# Patient Record
Sex: Female | Born: 1990 | Race: Black or African American | Hispanic: No | Marital: Single | Smoking: Current every day smoker
Health system: Southern US, Community
[De-identification: ages and names within clinical notes are randomized; demographics above are authoritative.]

## PROBLEM LIST (undated history)

## (undated) DIAGNOSIS — Z789 Other specified health status: Secondary | ICD-10-CM

## (undated) HISTORY — PX: NO PAST SURGERIES: SHX2092

## (undated) HISTORY — PX: OTHER SURGICAL HISTORY: SHX169

---

## 2011-11-15 DIAGNOSIS — O3430 Maternal care for cervical incompetence, unspecified trimester: Secondary | ICD-10-CM

## 2013-05-17 DIAGNOSIS — O3430 Maternal care for cervical incompetence, unspecified trimester: Secondary | ICD-10-CM

## 2017-03-12 DIAGNOSIS — O3430 Maternal care for cervical incompetence, unspecified trimester: Secondary | ICD-10-CM

## 2018-06-17 HISTORY — PX: CERVICAL CERCLAGE: SHX1329

## 2018-08-26 DIAGNOSIS — O3430 Maternal care for cervical incompetence, unspecified trimester: Secondary | ICD-10-CM

## 2019-04-04 ENCOUNTER — Emergency Department
Admission: EM | Admit: 2019-04-04 | Discharge: 2019-04-05 | Disposition: A | Discharge status: Home / Self Care | Payer: Medicaid Other | Attending: Emergency Medicine | Admitting: Emergency Medicine

## 2019-04-04 ENCOUNTER — Other Ambulatory Visit: Payer: Self-pay

## 2019-04-04 ENCOUNTER — Emergency Department: Payer: Medicaid Other

## 2019-04-04 DIAGNOSIS — N760 Acute vaginitis: Secondary | ICD-10-CM | POA: Insufficient documentation

## 2019-04-04 DIAGNOSIS — B9689 Other specified bacterial agents as the cause of diseases classified elsewhere: Secondary | ICD-10-CM | POA: Insufficient documentation

## 2019-04-04 DIAGNOSIS — R1032 Left lower quadrant pain: Secondary | ICD-10-CM | POA: Diagnosis not present

## 2019-04-04 DIAGNOSIS — O26891 Other specified pregnancy related conditions, first trimester: Secondary | ICD-10-CM

## 2019-04-04 DIAGNOSIS — O200 Threatened abortion: Secondary | ICD-10-CM

## 2019-04-04 DIAGNOSIS — O9089 Other complications of the puerperium, not elsewhere classified: Secondary | ICD-10-CM | POA: Diagnosis present

## 2019-04-04 DIAGNOSIS — Z3490 Encounter for supervision of normal pregnancy, unspecified, unspecified trimester: Secondary | ICD-10-CM

## 2019-04-04 LAB — CBC
HCT: 41 % (ref 36.0–46.0)
Hemoglobin: 14 g/dL (ref 12.0–15.0)
MCH: 32.4 pg (ref 26.0–34.0)
MCHC: 34.1 g/dL (ref 30.0–36.0)
MCV: 94.9 fL (ref 80.0–100.0)
Platelets: 309 10*3/uL (ref 150–400)
RBC: 4.32 MIL/uL (ref 3.87–5.11)
RDW: 13.7 % (ref 11.5–15.5)
WBC: 7.6 10*3/uL (ref 4.0–10.5)
nRBC: 0 % (ref 0.0–0.2)

## 2019-04-04 LAB — PREGNANCY, URINE: Preg Test, Ur: POSITIVE — AB

## 2019-04-04 LAB — WET PREP, GENITAL
Sperm: NONE SEEN
Trich, Wet Prep: NONE SEEN
Yeast Wet Prep HPF POC: NONE SEEN

## 2019-04-04 LAB — URINALYSIS, COMPLETE (UACMP) WITH MICROSCOPIC
Bacteria, UA: NONE SEEN
Bilirubin Urine: NEGATIVE
Glucose, UA: NEGATIVE mg/dL
Ketones, ur: 20 mg/dL — AB
Leukocytes,Ua: NEGATIVE
Nitrite: NEGATIVE
Protein, ur: NEGATIVE mg/dL
Specific Gravity, Urine: 1.024 (ref 1.005–1.030)
pH: 5 (ref 5.0–8.0)

## 2019-04-04 LAB — COMPREHENSIVE METABOLIC PANEL
ALT: 16 U/L (ref 0–44)
AST: 17 U/L (ref 15–41)
Albumin: 4.1 g/dL (ref 3.5–5.0)
Alkaline Phosphatase: 60 U/L (ref 38–126)
Anion gap: 9 (ref 5–15)
BUN: 10 mg/dL (ref 6–20)
CO2: 21 mmol/L — ABNORMAL LOW (ref 22–32)
Calcium: 9.2 mg/dL (ref 8.9–10.3)
Chloride: 110 mmol/L (ref 98–111)
Creatinine, Ser: 0.66 mg/dL (ref 0.44–1.00)
GFR calc Af Amer: >60 mL/min (ref >60)
GFR calc non Af Amer: >60 mL/min (ref >60)
Glucose, Bld: 96 mg/dL (ref 70–99)
Potassium: 3.7 mmol/L (ref 3.5–5.1)
Sodium: 140 mmol/L (ref 135–145)
Total Bilirubin: 0.3 mg/dL (ref 0.3–1.2)
Total Protein: 7.3 g/dL (ref 6.5–8.1)

## 2019-04-04 LAB — LIPASE, BLOOD: Lipase: 22 U/L (ref 11–51)

## 2019-04-04 LAB — ABO/RH: ABO/RH(D): O POS

## 2019-04-04 LAB — POCT PREGNANCY, URINE: Preg Test, Ur: POSITIVE — AB

## 2019-04-04 LAB — HCG, QUANTITATIVE, PREGNANCY: hCG, Beta Chain, Quant, S: 4191 m[IU]/mL — ABNORMAL HIGH (ref <5)

## 2019-04-04 MED ORDER — SODIUM CHLORIDE 0.9 % IV BOLUS
1000.0000 mL | Freq: Once | INTRAVENOUS | Status: AC
  Administered 2019-04-04: 1000 mL via INTRAVENOUS

## 2019-04-04 NOTE — ED Notes (Signed)
Pt stating she wants to leave due to not having a ride. Pt's phone dead. Pt given charger for phone, encouraged to stay. EDP in room telling pt benefits of staying.

## 2019-04-04 NOTE — ED Notes (Signed)
LMP 02/22/2019

## 2019-04-04 NOTE — ED Provider Notes (Signed)
[facility] Emergency Department Provider Note  ____________________________________________   First MD Initiated Contact with Patient 04/04/19 2105     (approximate)  I have reviewed the triage vital signs and the nursing notes.   HISTORY  Chief Complaint Abdominal Pain    HPI Kimberly Wyatt is a 28 y.o. female who had a vaginal delivery 6 months ago who was told that there was concern for some placenta being left place presents with abdominal cramping that is been getting worse.  Last menstrual period was seventh of September.  She is not breast-feeding.  Patient has had some intermittent vaginal bleeding but none currently.  Her abdominal cramping has been going on for about 1 week.  Is mostly in the left lower side.  The pain is moderate, intermittent, nothing makes it better, nothing makes it worse.  Denies any vomiting or fever.  Has a report of kidney stones but says that it was while she was pregnant and no interventions were done.      Medical: Pregnancy Surgical: None   Review of Systems Constitutional: No fever/chills Eyes: No visual changes. ENT: No sore throat. Cardiovascular: Denies chest pain. Respiratory: Denies shortness of breath. Gastrointestinal positive abdominal pain no nausea, no vomiting.  No diarrhea.  No constipation. Genitourinary: Negative for dysuria. Musculoskeletal: Negative for back pain. Skin: Negative for rash. Neurological: Negative for headaches, focal weakness or numbness. All other ROS negative ____________________________________________   PHYSICAL EXAM:  VITAL SIGNS: ED Triage Vitals  Enc Vitals Group     BP 04/04/19 1826 116/69     Pulse Rate 04/04/19 1826 (!) 106     Resp 04/04/19 1826 18     Temp 04/04/19 1826 98.3 F (36.8 C)     Temp Source 04/04/19 1826 Oral     SpO2 04/04/19 1826 100 %     Weight 04/04/19 1828 164 lb (74.4 kg)     Height 04/04/19 1828 5\' 4"  (1.626 m)     Head Circumference  --      Peak Flow --      Pain Score 04/04/19 1827 3     Pain Loc --      Pain Edu? --      Excl. in [place]? --     Constitutional: Alert and oriented. Well appearing and in no acute distress. Eyes: Conjunctivae are normal. EOMI. Head: Atraumatic. Nose: No congestion/rhinnorhea. Mouth/Throat: Mucous membranes are moist.   Neck: No stridor. Trachea Midline. FROM Cardiovascular: Normal rate, regular rhythm. Grossly normal heart sounds.  Good peripheral circulation. Respiratory: Normal respiratory effort.  No retractions. Lungs CTAB. Gastrointestinal: Soft with slight left lower quadrant tenderness.  No rebound.  No guarding. No distention. No abdominal bruits.  Musculoskeletal: No lower extremity tenderness nor edema.  No joint effusions. Neurologic:  Normal speech and language. No gross focal neurologic deficits are appreciated.  Skin:  Skin is warm, dry and intact. No rash noted. Psychiatric: Mood and affect are normal. Speech and behavior are normal. GU: cervix closed, no cervical motion tenderness or adnexal tenderness, no discharge  ____________________________________________   LABS (all labs ordered are listed, but only abnormal results are displayed)  Labs Reviewed  WET PREP, GENITAL - Abnormal; Notable for the following components:      Result Value   Clue Cells Wet Prep HPF POC PRESENT (*)    WBC, Wet Prep HPF POC MANY (*)    All other components within normal limits  COMPREHENSIVE METABOLIC PANEL - Abnormal; Notable for  the following components:   CO2 21 (*)    All other components within normal limits  URINALYSIS, COMPLETE (UACMP) WITH MICROSCOPIC - Abnormal; Notable for the following components:   Color, Urine YELLOW (*)    APPearance CLEAR (*)    Hgb urine dipstick MODERATE (*)    Ketones, ur 20 (*)    All other components within normal limits  PREGNANCY, URINE - Abnormal; Notable for the following components:   Preg Test, Ur POSITIVE (*)    All other components  within normal limits  HCG, QUANTITATIVE, PREGNANCY - Abnormal; Notable for the following components:   hCG, Beta Chain, Quant, S 4,191 (*)    All other components within normal limits  POCT PREGNANCY, URINE - Abnormal; Notable for the following components:   Preg Test, Ur POSITIVE (*)    All other components within normal limits  GC/CHLAMYDIA PROBE AMP  LIPASE, BLOOD  CBC  POC URINE PREG, ED  ABO/RH   ____________________________________________   RADIOLOGY   Official radiology report(s): [facility]  Result Date: 04/04/2019 CLINICAL DATA:  28 year old pregnant female with left lower quadrant abdominal pain. EXAM: RENAL / URINARY TRACT ULTRASOUND COMPLETE COMPARISON:  None. FINDINGS: Right Kidney: Renal measurements: 11.1 x 4.6 x 4.2 cm = volume: 111 mL. Normal echogenicity. No hydronephrosis or shadowing stone. Left Kidney: Renal measurements: 10.4 x 5.3 x 3.7 cm = volume: 105 mL. Normal echogenicity. No hydronephrosis or shadowing stone. Bladder: Appears normal for degree of bladder distention. Other: None IMPRESSION: Unremarkable renal ultrasound. Electronically Signed   By: Elgie CollardArash  Radparvar M.D.   On: 04/04/2019 22:32    ____________________________________________   PROCEDURES  Procedure(s) performed (including Critical Care):  Procedures   ____________________________________________   INITIAL IMPRESSION / ASSESSMENT AND PLAN / ED COURSE  Susann GivensCeaasia Purdom was evaluated in Emergency Department on 04/04/2019 for the symptoms described in the history of present illness. She was evaluated in the context of the global COVID-19 pandemic, which necessitated consideration that the patient might be at risk for infection with the SARS-CoV-2 virus that causes COVID-19. Institutional protocols and algorithms that pertain to the evaluation of patients at risk for COVID-19 are in a state of rapid change based on information released by regulatory bodies including the CDC and federal and  state organizations. These policies and algorithms were followed during the patient's care in the ED.    Is a 28 year old who presents with left lower quadrant pain.  Patient  pregnancy test was positive.  Will get hCG and transvaginal ultrasound to evaluate for pregnancy, retained products of conception, miscarriage.,  Ectopic.  No left lower quadrant pain to suggest appendicitis.  Patient's urine does have some RBCs and crystals in it.  I do have low suspicion for significant kidney stone given patient very well-appearing afebrile will do ultrasound to evaluate for hydronephrosis given this report of kidney stones.   Labs are reassuring with normal hemoglobin.  No evidence of AKI.  UA with 20 ketones and 11-20 RBCs and does have calcium oxalate crystals.  Ultrasound without evidence of kidney stone.  hCG elevated at 4191  Patient handed off to oncoming team pending transvaginal ultrasound to rule out ectopic pregnancy.  Patient declined prophylactic treatment for gonorrhea and chlamydia and will follow up with results.       ____________________________________________   FINAL CLINICAL IMPRESSION(S) / ED DIAGNOSES   Final diagnoses:  Pregnancy, unspecified gestational age      MEDICATIONS GIVEN DURING THIS VISIT:  Medications  sodium chloride 0.9 %  bolus 1,000 mL (1,000 mLs Intravenous New Bag/Given 04/04/19 2205)     ED Discharge Orders    None       Note:  This document was prepared using Dragon voice recognition software and may include unintentional dictation errors.   Concha Se, MD 04/04/19 2322

## 2019-04-04 NOTE — ED Notes (Signed)
Patient transported to Ultrasound 

## 2019-04-04 NOTE — ED Notes (Signed)
Pt states she is not having bleeding now but a little bit of abdominal cramping.

## 2019-04-04 NOTE — ED Triage Notes (Signed)
Pt reports vaginal delivery 6 mos ago, pt states that she was told some placenta was left in and she bled a lot, pt states that she has cramped since and the past week its gotten worse. Pt denies bleeding at this time, lmp was sept 7th.

## 2019-04-05 MED ORDER — PRENATAL MULTIVITAMIN CH: 1.0000 | ORAL_TABLET | Freq: Every day | ORAL | 3 refills | Status: AC

## 2019-04-05 MED ORDER — METRONIDAZOLE 500 MG PO TABS: 500.0000 mg | ORAL_TABLET | Freq: Two times a day (BID) | ORAL | 0 refills | Status: DC

## 2019-04-05 NOTE — ED Provider Notes (Signed)
Procedures     ----------------------------------------- 12:47 AM on 04/05/2019 -----------------------------------------   Blood type is O+.  Ultrasound reveals IUP.  Wet prep shows bacterial vaginosis.  Plan to start Flagyl, prenatal vitamin, follow-up with obstetrics.   Carrie Mew, MD 04/05/19 [phone]

## 2019-04-05 NOTE — ED Notes (Signed)
Pt taking [employer] per pt. Pt left to lobby.

## 2019-04-05 NOTE — ED Notes (Signed)
Pt states she is ready to go, pt encouraged to wait on results and speak to MD

## 2019-04-05 NOTE — Discharge Instructions (Addendum)
Your lab tests and ultrasound today were reassuring.  Your pregnancy is located in the normal location in your uterus.  Your HCG level today was 4,200.  You do have bacterial vaginosis, which can be treated with Flagyl.  Please follow up with obstetrics in 3 days to further evaluate your symptoms.

## 2019-04-07 LAB — GC/CHLAMYDIA PROBE AMP
Chlamydia trachomatis, NAA: NEGATIVE
Neisseria Gonorrhoeae by PCR: NEGATIVE

## 2019-04-21 ENCOUNTER — Ambulatory Visit (INDEPENDENT_AMBULATORY_CARE_PROVIDER_SITE_OTHER): Payer: Medicaid Other

## 2019-04-21 ENCOUNTER — Ambulatory Visit (INDEPENDENT_AMBULATORY_CARE_PROVIDER_SITE_OTHER): Payer: Medicaid Other | Admitting: Certified Nurse Midwife

## 2019-04-21 ENCOUNTER — Encounter: Payer: Self-pay | Admitting: Certified Nurse Midwife

## 2019-04-21 ENCOUNTER — Other Ambulatory Visit: Payer: Self-pay | Admitting: Certified Nurse Midwife

## 2019-04-21 ENCOUNTER — Other Ambulatory Visit: Payer: Self-pay

## 2019-04-21 VITALS — BP 106/65 | HR 83 | Ht 64.0 in | Wt 175.1 lb

## 2019-04-21 DIAGNOSIS — Z789 Other specified health status: Secondary | ICD-10-CM

## 2019-04-21 DIAGNOSIS — Z3687 Encounter for antenatal screening for uncertain dates: Secondary | ICD-10-CM | POA: Diagnosis not present

## 2019-04-21 DIAGNOSIS — N912 Amenorrhea, unspecified: Secondary | ICD-10-CM | POA: Diagnosis not present

## 2019-04-21 DIAGNOSIS — Z3A01 Less than 8 weeks gestation of pregnancy: Secondary | ICD-10-CM

## 2019-04-21 DIAGNOSIS — Z3401 Encounter for supervision of normal first pregnancy, first trimester: Secondary | ICD-10-CM

## 2019-04-21 NOTE — Patient Instructions (Signed)

## 2019-04-21 NOTE — Progress Notes (Signed)
Subjective:    Kimberly Wyatt is a 28 y.o. female who presents for evaluation of amenorrhea. She believes she could be pregnant. Pregnancy is desired. Sexual Activity: none. Current symptoms also include: breast tenderness. Last period was normal.   No LMP recorded. The following portions of the patient's history were reviewed and updated as appropriate: allergies, current medications, past family history, past medical history, past social history, past surgical history and problem list.  Review of Systems Pertinent items are noted in HPI.     Objective:    There were no vitals taken for this visit. General: alert, cooperative, appears stated age, no distress and no acute distress    Lab Review Urine HCG: positive    Patient Name: Kimberly Wyatt DOB: 30-Oct-1990 MRN: [id number] ULTRASOUND REPORT  Location: [facility] Date of Service: 04/21/2019   Indications:dating Findings:  Nelda [place] intrauterine pregnancy is visualized with a CRL consistent with [redacted]w[redacted]d gestation, giving an (U/S) EDD of 12/06/2019.   FHR: 156 BPM CRL measurement: 13.9 mm Yolk sac is visualized and appears normal and early anatomy is normal. Amnion: visualized and appears normal   Right Ovary is normal in appearance. Left Ovary is normal appearance. Corpus luteal cyst:  is not visualized Survey of the adnexa demonstrates no adnexal masses. There is no free peritoneal fluid in the cul de sac.  Impression: 1. [redacted]w[redacted]d Viable Singleton Intrauterine pregnancy by U/S. 2. (U/S) EDD is 12/06/2019.  Recommendations: 1.Clinical correlation with the patient's History and Physical Exam.  Jenine M. Alessi     RDMS   Assessment:    Absence of menstruation.     Plan:   Positive: EDC: 12/05/24. Briefly discussed pre-natal care options.MiD  Or Midwifer care discussed. Pt would like to be midwife pt . Encouraged well-balanced diet, plenty of rest when needed, pre-natal vitamins daily and walking for exercise.  Discussed self-help for nausea, avoiding OTC medications until consulting provider or pharmacist, other than Tylenol as needed, minimal caffeine (1-2 cups daily) and avoiding alcohol. She will schedule her initial OB visit in the next month with her PCP or OB provider. Feel free to call with any questions

## 2019-05-10 ENCOUNTER — Ambulatory Visit (INDEPENDENT_AMBULATORY_CARE_PROVIDER_SITE_OTHER): Payer: Medicaid Other | Admitting: Certified Nurse Midwife

## 2019-05-10 ENCOUNTER — Other Ambulatory Visit: Payer: Self-pay

## 2019-05-10 VITALS — BP 112/75 | HR 90 | Ht 64.0 in | Wt 170.5 lb

## 2019-05-10 DIAGNOSIS — Z3401 Encounter for supervision of normal first pregnancy, first trimester: Secondary | ICD-10-CM

## 2019-05-10 NOTE — Progress Notes (Signed)
Ellard Artis presents for [facility] nurse interview visit. Pregnancy confirmation done 04/04/2019. G7 [id number] Pregnancy education material explained and given. 2 cats in home. NOB labs ordered.  Sickle cell ordered due to patient's race. HIV labs and drug screen were explained and ordered. PNV encouraged. Genetic screening options discussed. Genetic testing: Unsure. Patient may discuss with the provider. Patient to follow up with provider on 05/24/2019 for NOB physical. All questions answered.

## 2019-05-11 LAB — MICROSCOPIC EXAMINATION: Casts: NONE SEEN /lpf

## 2019-05-11 LAB — URINALYSIS, ROUTINE W REFLEX MICROSCOPIC
Bilirubin, UA: NEGATIVE
Glucose, UA: NEGATIVE
Nitrite, UA: NEGATIVE
Specific Gravity, UA: >=1.03 — AB (ref 1.005–1.030)
Urobilinogen, Ur: 0.2 mg/dL (ref 0.2–1.0)
pH, UA: 6 (ref 5.0–7.5)

## 2019-05-12 LAB — MONITOR DRUG PROFILE 14(MW)
Amphetamine Scrn, Ur: NEGATIVE ng/mL
BARBITURATE SCREEN URINE: NEGATIVE ng/mL
BENZODIAZEPINE SCREEN, URINE: NEGATIVE ng/mL
Buprenorphine, Urine: NEGATIVE ng/mL
CANNABINOIDS UR QL SCN: NEGATIVE ng/mL
Cocaine (Metab) Scrn, Ur: NEGATIVE ng/mL
Creatinine(Crt), U: 269 mg/dL (ref 20.0–300.0)
Fentanyl, Urine: NEGATIVE pg/mL
Meperidine Screen, Urine: NEGATIVE ng/mL
Methadone Screen, Urine: NEGATIVE ng/mL
OXYCODONE+OXYMORPHONE UR QL SCN: NEGATIVE ng/mL
Opiate Scrn, Ur: NEGATIVE ng/mL
Ph of Urine: 6.5 (ref 4.5–8.9)
Phencyclidine Qn, Ur: NEGATIVE ng/mL
Propoxyphene Scrn, Ur: NEGATIVE ng/mL
SPECIFIC GRAVITY: 1.026
Tramadol Screen, Urine: NEGATIVE ng/mL

## 2019-05-12 LAB — ANTIBODY SCREEN: Antibody Screen: NEGATIVE

## 2019-05-12 LAB — TOXOPLASMA ANTIBODIES- IGG AND  IGM
Toxoplasma Antibody- IgM: 4.8 AU/mL (ref 0.0–7.9)
Toxoplasma IgG Ratio: <3 IU/mL (ref 0.0–7.1)

## 2019-05-12 LAB — RUBELLA SCREEN: Rubella Antibodies, IGG: 16.1 index (ref >0.99)

## 2019-05-12 LAB — URINE CULTURE, OB REFLEX: Organism ID, Bacteria: NO GROWTH

## 2019-05-12 LAB — CULTURE, OB URINE

## 2019-05-12 LAB — ABO AND RH: Rh Factor: POSITIVE

## 2019-05-12 LAB — HIV ANTIBODY (ROUTINE TESTING W REFLEX): HIV Screen 4th Generation wRfx: NONREACTIVE

## 2019-05-12 LAB — NICOTINE SCREEN, URINE: Cotinine Ql Scrn, Ur: POSITIVE ng/mL — AB

## 2019-05-12 LAB — VARICELLA ZOSTER ANTIBODY, IGG: Varicella zoster IgG: 450 index (ref >165)

## 2019-05-12 LAB — RPR: RPR Ser Ql: NONREACTIVE

## 2019-05-12 LAB — HGB SOLU + RFLX FRAC: Sickle Solubility Test - HGBRFX: NEGATIVE

## 2019-05-12 LAB — HEPATITIS B SURFACE ANTIGEN: Hepatitis B Surface Ag: NEGATIVE

## 2019-05-13 LAB — GC/CHLAMYDIA PROBE AMP
Chlamydia trachomatis, NAA: NEGATIVE
Neisseria Gonorrhoeae by PCR: NEGATIVE

## 2019-05-24 ENCOUNTER — Encounter: Payer: Self-pay | Admitting: Certified Nurse Midwife

## 2019-05-24 ENCOUNTER — Other Ambulatory Visit: Payer: Self-pay

## 2019-05-24 ENCOUNTER — Telehealth: Payer: Self-pay

## 2019-05-24 ENCOUNTER — Ambulatory Visit (INDEPENDENT_AMBULATORY_CARE_PROVIDER_SITE_OTHER): Payer: Medicaid Other | Admitting: Certified Nurse Midwife

## 2019-05-24 VITALS — BP 100/65 | HR 83 | Wt 170.2 lb

## 2019-05-24 DIAGNOSIS — Z3401 Encounter for supervision of normal first pregnancy, first trimester: Secondary | ICD-10-CM

## 2019-05-24 NOTE — Telephone Encounter (Signed)
mychart message sent to patient re: schedule an MD appt to discuss cerclage.

## 2019-05-24 NOTE — Progress Notes (Signed)
Pt not seen.

## 2019-05-25 LAB — CBC
Hematocrit: 37.3 % (ref 34.0–46.6)
Hemoglobin: 12.8 g/dL (ref 11.1–15.9)
MCH: 32 pg (ref 26.6–33.0)
MCHC: 34.3 g/dL (ref 31.5–35.7)
MCV: 93 fL (ref 79–97)
Platelets: 242 10*3/uL (ref 150–450)
RBC: 4 x10E6/uL (ref 3.77–5.28)
RDW: 12.7 % (ref 11.7–15.4)
WBC: 9 10*3/uL (ref 3.4–10.8)

## 2019-05-26 ENCOUNTER — Encounter: Payer: Medicaid Other | Admitting: Certified Nurse Midwife

## 2019-05-28 ENCOUNTER — Other Ambulatory Visit: Payer: Self-pay

## 2019-05-28 DIAGNOSIS — Z20822 Contact with and (suspected) exposure to covid-19: Secondary | ICD-10-CM

## 2019-05-29 ENCOUNTER — Other Ambulatory Visit: Payer: Self-pay

## 2019-05-29 ENCOUNTER — Encounter: Payer: Self-pay | Admitting: Emergency Medicine

## 2019-05-29 ENCOUNTER — Emergency Department
Admission: EM | Admit: 2019-05-29 | Discharge: 2019-05-29 | Discharge status: Against Medical Advice | Payer: Medicaid Other | Attending: Emergency Medicine | Admitting: Emergency Medicine

## 2019-05-29 DIAGNOSIS — O99891 Other specified diseases and conditions complicating pregnancy: Secondary | ICD-10-CM | POA: Diagnosis not present

## 2019-05-29 DIAGNOSIS — Z79899 Other long term (current) drug therapy: Secondary | ICD-10-CM | POA: Insufficient documentation

## 2019-05-29 DIAGNOSIS — O99331 Smoking (tobacco) complicating pregnancy, first trimester: Secondary | ICD-10-CM | POA: Insufficient documentation

## 2019-05-29 DIAGNOSIS — O26899 Other specified pregnancy related conditions, unspecified trimester: Secondary | ICD-10-CM

## 2019-05-29 DIAGNOSIS — R1030 Lower abdominal pain, unspecified: Secondary | ICD-10-CM | POA: Diagnosis not present

## 2019-05-29 DIAGNOSIS — Z3A12 12 weeks gestation of pregnancy: Secondary | ICD-10-CM | POA: Diagnosis not present

## 2019-05-29 DIAGNOSIS — F17210 Nicotine dependence, cigarettes, uncomplicated: Secondary | ICD-10-CM | POA: Insufficient documentation

## 2019-05-29 LAB — COMPREHENSIVE METABOLIC PANEL
ALT: 15 U/L (ref 0–44)
AST: 13 U/L — ABNORMAL LOW (ref 15–41)
Albumin: 3.3 g/dL — ABNORMAL LOW (ref 3.5–5.0)
Alkaline Phosphatase: 49 U/L (ref 38–126)
Anion gap: 8 (ref 5–15)
BUN: 9 mg/dL (ref 6–20)
CO2: 21 mmol/L — ABNORMAL LOW (ref 22–32)
Calcium: 9.1 mg/dL (ref 8.9–10.3)
Chloride: 107 mmol/L (ref 98–111)
Creatinine, Ser: 0.48 mg/dL (ref 0.44–1.00)
GFR calc Af Amer: >60 mL/min (ref >60)
GFR calc non Af Amer: >60 mL/min (ref >60)
Glucose, Bld: 87 mg/dL (ref 70–99)
Potassium: 3.7 mmol/L (ref 3.5–5.1)
Sodium: 136 mmol/L (ref 135–145)
Total Bilirubin: 0.3 mg/dL (ref 0.3–1.2)
Total Protein: 6.6 g/dL (ref 6.5–8.1)

## 2019-05-29 LAB — WET PREP, GENITAL
Clue Cells Wet Prep HPF POC: NONE SEEN
Sperm: NONE SEEN
Trich, Wet Prep: NONE SEEN

## 2019-05-29 LAB — CBC
HCT: 35.4 % — ABNORMAL LOW (ref 36.0–46.0)
Hemoglobin: 12 g/dL (ref 12.0–15.0)
MCH: 31.9 pg (ref 26.0–34.0)
MCHC: 33.9 g/dL (ref 30.0–36.0)
MCV: 94.1 fL (ref 80.0–100.0)
Platelets: 248 10*3/uL (ref 150–400)
RBC: 3.76 MIL/uL — ABNORMAL LOW (ref 3.87–5.11)
RDW: 13.2 % (ref 11.5–15.5)
WBC: 8.5 10*3/uL (ref 4.0–10.5)
nRBC: 0 % (ref 0.0–0.2)

## 2019-05-29 LAB — URINALYSIS, COMPLETE (UACMP) WITH MICROSCOPIC
Bilirubin Urine: NEGATIVE
Glucose, UA: NEGATIVE mg/dL
Ketones, ur: NEGATIVE mg/dL
Leukocytes,Ua: NEGATIVE
Nitrite: NEGATIVE
Protein, ur: NEGATIVE mg/dL
Specific Gravity, Urine: 1.021 (ref 1.005–1.030)
WBC, UA: NONE SEEN WBC/hpf (ref 0–5)
pH: 6 (ref 5.0–8.0)

## 2019-05-29 LAB — HCG, QUANTITATIVE, PREGNANCY: hCG, Beta Chain, Quant, S: 51893 m[IU]/mL — ABNORMAL HIGH (ref <5)

## 2019-05-29 LAB — LIPASE, BLOOD: Lipase: 21 U/L (ref 11–51)

## 2019-05-29 MED ORDER — SODIUM CHLORIDE 0.9% FLUSH: 3.0000 mL | Freq: Once | INTRAVENOUS | Status: DC

## 2019-05-29 NOTE — ED Notes (Signed)
Returned to room to check on patient and send sample to lab and found the room vacant. The linens were stripped from the bed and her wet prep sample was left on the counter by the computer. There were no personal belongings left in the room. This nurse labeled the sample and sent it to the lab. Charge nurse and Dr Archie Balboa notified about elopement.

## 2019-05-29 NOTE — ED Triage Notes (Signed)
Lower abdominal discomfort x 2 days. States is [redacted] weeks pregnant. Denies bleeding at present however states had some spotting last week. States had ultrasound in beginning of pregnancy and showed one intrauterine fetus. States this is 7th pregnancy. Denies being Rh negative.

## 2019-05-29 NOTE — ED Provider Notes (Signed)
[facility] Emergency Department Provider Note  ____________________________________________   I have reviewed the triage vital signs and the nursing notes.   HISTORY  Chief Complaint Abdominal Pain   History limited by: Not Limited   HPI Kimberly Wyatt is a 28 y.o. female who presents to the emergency department today because of concern for lower abdominal pain in the setting of being roughly [redacted] weeks pregnant. The patient has been having some pain on and off for the past couple of days. Located in the lower abdomen. The patient says that she did notice some spotting last week prior to the pain starting. She is working on getting scheduled for a cerclage.    Records reviewed. Per medical record review patient has a history of US performed in the emergency department earlier in this pregnancy which showed a single intrauterine pregnancy.   History reviewed. No pertinent past medical history.  There are no problems to display for this patient.   History reviewed. No pertinent surgical history.  Prior to Admission medications   Medication Sig Start Date End Date Taking? Authorizing Provider  Prenatal Vit-Fe Fumarate-FA (PRENATAL MULTIVITAMIN) TABS tablet Take 1 tablet by mouth daily at 12 noon. 04/05/19   Sharman Cheek, MD    Allergies Mango flavor  No family history on file.  Social History Social History   Tobacco Use  . Smoking status: Current Every Day Smoker    Types: Cigarettes  . Smokeless tobacco: Never Used  Substance Use Topics  . Alcohol use: Not Currently  . Drug use: Never    Review of Systems Constitutional: No fever/chills Eyes: No visual changes. ENT: No sore throat. Cardiovascular: Denies chest pain. Respiratory: Denies shortness of breath. Gastrointestinal: Positive for lower abdominal pain. Genitourinary: Positive for vaginal spotting.  Musculoskeletal: Negative for back pain. Skin: Negative for  rash. Neurological: Negative for headaches, focal weakness or numbness.  ____________________________________________   PHYSICAL EXAM:  VITAL SIGNS: ED Triage Vitals  Enc Vitals Group     BP 05/29/19 1656 115/63     Pulse Rate 05/29/19 1656 81     Resp 05/29/19 1656 20     Temp 05/29/19 1656 98.5 F (36.9 C)     Temp src --      SpO2 05/29/19 1656 100 %     Weight 05/29/19 1657 170 lb (77.1 kg)     Height 05/29/19 1657 5\' 4"  (1.626 m)     Head Circumference --      Peak Flow --      Pain Score 05/29/19 1656 7   Constitutional: Alert and oriented.  Eyes: Conjunctivae are normal.  ENT      Head: Normocephalic and atraumatic.      Nose: No congestion/rhinnorhea.      Mouth/Throat: Mucous membranes are moist.      Neck: No stridor. Hematological/Lymphatic/Immunilogical: No cervical lymphadenopathy. Cardiovascular: Normal rate, regular rhythm.  No murmurs, rubs, or gallops.  Respiratory: Normal respiratory effort without tachypnea nor retractions. Breath sounds are clear and equal bilaterally. No wheezes/rales/rhonchi. Gastrointestinal: Soft and non tender. No rebound. No guarding.  Genitourinary: Deferred Musculoskeletal: Normal range of motion in all extremities. No lower extremity edema. Neurologic:  Normal speech and language. No gross focal neurologic deficits are appreciated.  Skin:  Skin is warm, dry and intact. No rash noted. Psychiatric: Mood and affect are normal. Speech and behavior are normal. Patient exhibits appropriate insight and judgment.  ____________________________________________    LABS (pertinent positives/negatives)  hcg [phone] Lipase 21  CBC wbc 8.5, hgb 12.0, plt 248 CMP wnl except co2 21, alb 3.3, ast 13 UA turbid, small hgb dipstick, wbc none seen, rbc 0-5, rare bacteria.  ____________________________________________   EKG  None  ____________________________________________     RADIOLOGY  None  ____________________________________________   PROCEDURES  Procedures  ____________________________________________   INITIAL IMPRESSION / ASSESSMENT AND PLAN / ED COURSE  Pertinent labs & imaging results that were available during my care of the patient were reviewed by me and considered in my medical decision making (see chart for details).   Patient presents to the emergency department with complaint of lower abdominal pain. Patient is [redacted] weeks pregnant. On exam no abdominal tenderness. Bedside US shows single intrauterine fetus with good cardiac activity. Patient states she has follow up scheduled for early next week. Did discuss with patient about checking a wet prep for infection. The patient obtained a sample however left prior to the results.   ____________________________________________   FINAL CLINICAL IMPRESSION(S) / ED DIAGNOSES  Final diagnoses:  Abdominal pain during pregnancy, antepartum     Note: This dictation was prepared with Dragon dictation. Any transcriptional errors that result from this process are unintentional     Nance Pear, MD 05/29/19 2039

## 2019-05-30 LAB — NOVEL CORONAVIRUS, NAA: SARS-CoV-2, NAA: NOT DETECTED

## 2019-05-31 ENCOUNTER — Telehealth: Payer: Self-pay

## 2019-05-31 NOTE — Telephone Encounter (Signed)
Patient would like to know her results from the genetics test. Please call

## 2019-06-01 ENCOUNTER — Encounter: Payer: Medicaid Other | Admitting: Obstetrics and Gynecology

## 2019-06-01 ENCOUNTER — Telehealth: Payer: Self-pay

## 2019-06-16 ENCOUNTER — Encounter: Payer: Medicaid Other | Admitting: Obstetrics and Gynecology

## 2019-06-29 ENCOUNTER — Encounter: Payer: Medicaid Other | Admitting: Certified Nurse Midwife

## 2019-06-29 ENCOUNTER — Ambulatory Visit (INDEPENDENT_AMBULATORY_CARE_PROVIDER_SITE_OTHER): Payer: Medicaid Other | Admitting: Certified Nurse Midwife

## 2019-06-29 ENCOUNTER — Other Ambulatory Visit: Payer: Self-pay

## 2019-06-29 VITALS — BP 100/70 | HR 90 | Ht 64.0 in | Wt 170.0 lb

## 2019-06-29 DIAGNOSIS — Z3482 Encounter for supervision of other normal pregnancy, second trimester: Secondary | ICD-10-CM | POA: Diagnosis not present

## 2019-06-29 DIAGNOSIS — Z9889 Other specified postprocedural states: Secondary | ICD-10-CM

## 2019-06-29 DIAGNOSIS — Z23 Encounter for immunization: Secondary | ICD-10-CM

## 2019-06-29 DIAGNOSIS — O09292 Supervision of pregnancy with other poor reproductive or obstetric history, second trimester: Secondary | ICD-10-CM

## 2019-06-29 DIAGNOSIS — Z8751 Personal history of pre-term labor: Secondary | ICD-10-CM

## 2019-06-29 DIAGNOSIS — O09299 Supervision of pregnancy with other poor reproductive or obstetric history, unspecified trimester: Secondary | ICD-10-CM

## 2019-06-29 MED ORDER — ASPIRIN EC 81 MG PO TBEC: 81.0000 mg | DELAYED_RELEASE_TABLET | Freq: Every day | ORAL | 6 refills | Status: AC

## 2019-06-29 NOTE — Progress Notes (Signed)
Lactation student spoke with Carisa about the benefits of breastfeeding as well as discussed breastfeeding information per the Ready, Set, Baby curriculum. Questions answered related to breastfeeding.

## 2019-06-29 NOTE — Patient Instructions (Signed)

## 2019-06-29 NOTE — Progress Notes (Signed)
ROB doing well. She thinks she is feeling some pressure. Discussed use of 17 p given history of 21 wk delivery and PTD at 27/36 wks. She states she has done before and is willing to do with this pregnancy. Information given. Pt encouraged to take 81 mg baby aspirin daily due to history of adverse pregnancy outcome.  To pt. PMHF completed , Rose into to see pt. Discussed anatomy scan at next appointment. She verbalizes understanding. Follow up 3 wks.   Doreene Burke, CNM

## 2019-07-02 ENCOUNTER — Encounter: Payer: Self-pay | Admitting: Obstetrics and Gynecology

## 2019-07-02 ENCOUNTER — Ambulatory Visit (INDEPENDENT_AMBULATORY_CARE_PROVIDER_SITE_OTHER): Payer: Medicaid Other | Admitting: Obstetrics and Gynecology

## 2019-07-02 ENCOUNTER — Encounter: Payer: Medicaid Other | Admitting: Obstetrics and Gynecology

## 2019-07-02 ENCOUNTER — Other Ambulatory Visit: Payer: Self-pay

## 2019-07-02 VITALS — BP 97/61 | HR 89 | Wt 168.0 lb

## 2019-07-02 DIAGNOSIS — O09292 Supervision of pregnancy with other poor reproductive or obstetric history, second trimester: Secondary | ICD-10-CM

## 2019-07-02 DIAGNOSIS — O09299 Supervision of pregnancy with other poor reproductive or obstetric history, unspecified trimester: Secondary | ICD-10-CM

## 2019-07-02 DIAGNOSIS — Z9889 Other specified postprocedural states: Secondary | ICD-10-CM

## 2019-07-02 DIAGNOSIS — Z3A18 18 weeks gestation of pregnancy: Secondary | ICD-10-CM | POA: Diagnosis not present

## 2019-07-02 DIAGNOSIS — O0992 Supervision of high risk pregnancy, unspecified, second trimester: Secondary | ICD-10-CM

## 2019-07-02 DIAGNOSIS — O09212 Supervision of pregnancy with history of pre-term labor, second trimester: Secondary | ICD-10-CM | POA: Diagnosis not present

## 2019-07-02 DIAGNOSIS — Z8751 Personal history of pre-term labor: Secondary | ICD-10-CM

## 2019-07-02 LAB — POCT URINALYSIS DIPSTICK OB
Bilirubin, UA: NEGATIVE
Glucose, UA: NEGATIVE
Leukocytes, UA: NEGATIVE
Nitrite, UA: NEGATIVE
Spec Grav, UA: 1.025 (ref 1.010–1.025)
Urobilinogen, UA: 0.2 E.U./dL
pH, UA: 6.5 (ref 5.0–8.0)

## 2019-07-02 MED ORDER — HYDROXYPROGESTERONE CAPROATE 250 MG/ML IM OIL
250.0000 mg | TOPICAL_OIL | Freq: Once | INTRAMUSCULAR | Status: AC
  Administered 2019-07-02: 250 mg via INTRAMUSCULAR

## 2019-07-02 NOTE — Progress Notes (Signed)
ROB-Pt present for routine prenatal care, cerclage and 17 P injection. Pt stated having pain/pressure in the pelvic area.

## 2019-07-02 NOTE — H&P (View-Only) (Signed)
GYNECOLOGY PREOPERATIVE HISTORY AND PHYSICAL   Subjective:  Kimberly Wyatt is a 29 y.o. [id number] currently ~ [redacted] weeks gestation, EDD 12/06/2019 at here for surgical management of history of preterm delivery, history of cervical incompetency.   No significant preoperative concerns.  Proposed surgery: McDonald Cervical Cerclage    Pertinent Gynecological History: Menses: Patient's last menstrual period was 02/22/2019.  Normal menstrual cycles.  Last pap: patient thinks she had one last pregnancy.    History reviewed. No pertinent past medical history.    Past Surgical History:  Procedure Laterality Date  . CERVICAL CERCLAGE  2020   removed at [redacted] weeks pregnant    OB History  Gravida Para Term Preterm AB Living  7 5 2 3 1 3   SAB TAB Ectopic Multiple Live Births    1     5    # Outcome Date GA Lbr Len/2nd Weight Sex Delivery Anes PTL Lv  7 Current           6 Term 08/26/18 [redacted]w[redacted]d  6 lb 5 oz (2.863 kg) M Vag-Spont  N LIV     Complications: Premature dilatation of cervix during pregnancy  5 Preterm 03/12/17 [redacted]w[redacted]d  1 lb (0.454 kg) M Vag-Spont  Y ND     Complications: Premature dilatation of cervix during pregnancy  4 Preterm 09/29/13 [redacted]w[redacted]d      Y ND  3 Preterm 05/17/13 [redacted]w[redacted]d  6 lb 9 oz (2.977 kg) F Vag-Spont None N LIV     Complications: Premature dilatation of cervix during pregnancy  2 Term 11/15/11 [redacted]w[redacted]d  6 lb 9 oz (2.977 kg) M Vag-Spont EPI N LIV     Complications: Premature dilatation of cervix during pregnancy  1 TAB 03/02/08            Obstetric Comments  Cervical cerclage placed in G6 pregnancy    Family History  Problem Relation Age of Onset  . Healthy Mother   . Diabetes Father     Social History   Socioeconomic History  . Marital status: Single    Spouse name: Not on file  . Number of children: Not on file  . Years of education: Not on file  . Highest education level: Not on file  Occupational History  . Not on file  Tobacco Use  . Smoking  status: Current Every Day Smoker    Types: Cigarettes  . Smokeless tobacco: Never Used  Substance and Sexual Activity  . Alcohol use: Not Currently  . Drug use: Never  . Sexual activity: Yes  Other Topics Concern  . Not on file  Social History Narrative  . Not on file   Social Determinants of Health   Financial Resource Strain:   . Difficulty of Paying Living Expenses: Not on file  Food Insecurity:   . Worried About 03/04/08 in the Last Year: Not on file  . Ran Out of Food in the Last Year: Not on file  Transportation Needs:   . Lack of Transportation (Medical): Not on file  . Lack of Transportation (Non-Medical): Not on file  Physical Activity:   . Days of Exercise per Week: Not on file  . Minutes of Exercise per Session: Not on file  Stress:   . Feeling of Stress : Not on file  Social Connections:   . Frequency of Communication with Friends and Family: Not on file  . Frequency of Social Gatherings with Friends and Family: Not on file  . Attends  Religious Services: Not on file  . Active Member of Clubs or Organizations: Not on file  . Attends Archivist Meetings: Not on file  . Marital Status: Not on file  Intimate Partner Violence:   . Fear of Current or Ex-Partner: Not on file  . Emotionally Abused: Not on file  . Physically Abused: Not on file  . Sexually Abused: Not on file    Current Outpatient Medications on File Prior to Visit  Medication Sig Dispense Refill  . aspirin EC 81 MG tablet Take 1 tablet (81 mg total) by mouth daily. 30 tablet 6  . Prenatal Vit-Fe Fumarate-FA (PRENATAL MULTIVITAMIN) TABS tablet Take 1 tablet by mouth daily at 12 noon. 90 tablet 3   No current facility-administered medications on file prior to visit.    Allergies  Allergen Reactions  . Mango Flavor Swelling     Review of Systems Constitutional: No recent fever/chills/sweats Respiratory: No recent cough/bronchitis Cardiovascular: No chest  pain Gastrointestinal: No recent nausea/vomiting/diarrhea Genitourinary: No UTI symptoms Hematologic/lymphatic:No history of coagulopathy or recent blood thinner use    Objective:   Blood pressure 97/61, pulse 89, weight 168 lb (76.2 kg), last menstrual period 02/22/2019. CONSTITUTIONAL: Well-developed, well-nourished female in no acute distress.  HENT:  Normocephalic, atraumatic, External right and left ear normal. Oropharynx is clear and moist EYES: Conjunctivae and EOM are normal. Pupils are equal, round, and reactive to light. No scleral icterus.  NECK: Normal range of motion, supple, no masses SKIN: Skin is warm and dry. No rash noted. Not diaphoretic. No erythema. No pallor. NEUROLOGIC: Alert and oriented to person, place, and time. Normal reflexes, muscle tone coordination. No cranial nerve deficit noted. PSYCHIATRIC: Normal mood and affect. Normal behavior. Normal judgment and thought content. CARDIOVASCULAR: Normal heart rate noted, regular rhythm RESPIRATORY: Effort and breath sounds normal, no problems with respiration noted ABDOMEN: Soft, nontender, nondistended. PELVIC: Deferred MUSCULOSKELETAL: Normal range of motion. No edema and no tenderness. 2+ distal pulses.    Labs: Lab Results  Component Value Date   WBC 8.5 05/29/2019   HGB 12.0 05/29/2019   HCT 35.4 (L) 05/29/2019   MCV 94.1 05/29/2019   PLT 248 05/29/2019     Imaging Studies: None  Assessment:    1. Supervision of high risk pregnancy in second trimester   2. History of preterm delivery   3. History of pregnancy loss in prior pregnancy, currently pregnant in second trimester   4. History of cerclage, currently pregnant     Plan:    Counseling: Procedure, risks, reasons, benefits and complications (including injury to bladder, rectum, major blood vessel, ureter, bleeding, possibility of transfusion, infection, rupture of membranes) reviewed in detail. Likelihood of success in alleviating the  patient's condition was discussed. Routine postoperative instructions will be reviewed with the patient and her family in detail after surgery.  The patient concurred with the proposed plan, giving informed written consent for the surgery.   Preop testing ordered. Instructions reviewed, including NPO after midnight.      Rubie Maid, MD [facility]

## 2019-07-02 NOTE — H&P (Signed)
GYNECOLOGY PREOPERATIVE HISTORY AND PHYSICAL   Subjective:  Kimberly Wyatt is a 29 y.o. [id number] currently ~ [redacted] weeks gestation, EDD 12/06/2019 at here for surgical management of history of preterm delivery, history of cervical incompetency.   No significant preoperative concerns.  Proposed surgery: McDonald Cervical Cerclage    Pertinent Gynecological History: Menses: Patient's last menstrual period was 02/22/2019.  Normal menstrual cycles.  Last pap: patient thinks she had one last pregnancy.    History reviewed. No pertinent past medical history.    Past Surgical History:  Procedure Laterality Date  . CERVICAL CERCLAGE  2020   removed at [redacted] weeks pregnant    OB History  Gravida Para Term Preterm AB Living  7 5 2 3 1 3   SAB TAB Ectopic Multiple Live Births    1     5    # Outcome Date GA Lbr Len/2nd Weight Sex Delivery Anes PTL Lv  7 Current           6 Term 08/26/18 [redacted]w[redacted]d  6 lb 5 oz (2.863 kg) M Vag-Spont  N LIV     Complications: Premature dilatation of cervix during pregnancy  5 Preterm 03/12/17 [redacted]w[redacted]d  1 lb (0.454 kg) M Vag-Spont  Y ND     Complications: Premature dilatation of cervix during pregnancy  4 Preterm 09/29/13 [redacted]w[redacted]d      Y ND  3 Preterm 05/17/13 [redacted]w[redacted]d  6 lb 9 oz (2.977 kg) F Vag-Spont None N LIV     Complications: Premature dilatation of cervix during pregnancy  2 Term 11/15/11 [redacted]w[redacted]d  6 lb 9 oz (2.977 kg) M Vag-Spont EPI N LIV     Complications: Premature dilatation of cervix during pregnancy  1 TAB 03/02/08            Obstetric Comments  Cervical cerclage placed in G6 pregnancy    Family History  Problem Relation Age of Onset  . Healthy Mother   . Diabetes Father     Social History   Socioeconomic History  . Marital status: Single    Spouse name: Not on file  . Number of children: Not on file  . Years of education: Not on file  . Highest education level: Not on file  Occupational History  . Not on file  Tobacco Use  . Smoking  status: Current Every Day Smoker    Types: Cigarettes  . Smokeless tobacco: Never Used  Substance and Sexual Activity  . Alcohol use: Not Currently  . Drug use: Never  . Sexual activity: Yes  Other Topics Concern  . Not on file  Social History Narrative  . Not on file   Social Determinants of Health   Financial Resource Strain:   . Difficulty of Paying Living Expenses: Not on file  Food Insecurity:   . Worried About 03/04/08 in the Last Year: Not on file  . Ran Out of Food in the Last Year: Not on file  Transportation Needs:   . Lack of Transportation (Medical): Not on file  . Lack of Transportation (Non-Medical): Not on file  Physical Activity:   . Days of Exercise per Week: Not on file  . Minutes of Exercise per Session: Not on file  Stress:   . Feeling of Stress : Not on file  Social Connections:   . Frequency of Communication with Friends and Family: Not on file  . Frequency of Social Gatherings with Friends and Family: Not on file  . Attends  Religious Services: Not on file  . Active Member of Clubs or Organizations: Not on file  . Attends Club or Organization Meetings: Not on file  . Marital Status: Not on file  Intimate Partner Violence:   . Fear of Current or Ex-Partner: Not on file  . Emotionally Abused: Not on file  . Physically Abused: Not on file  . Sexually Abused: Not on file    Current Outpatient Medications on File Prior to Visit  Medication Sig Dispense Refill  . aspirin EC 81 MG tablet Take 1 tablet (81 mg total) by mouth daily. 30 tablet 6  . Prenatal Vit-Fe Fumarate-FA (PRENATAL MULTIVITAMIN) TABS tablet Take 1 tablet by mouth daily at 12 noon. 90 tablet 3   No current facility-administered medications on file prior to visit.    Allergies  Allergen Reactions  . Mango Flavor Swelling     Review of Systems Constitutional: No recent fever/chills/sweats Respiratory: No recent cough/bronchitis Cardiovascular: No chest  pain Gastrointestinal: No recent nausea/vomiting/diarrhea Genitourinary: No UTI symptoms Hematologic/lymphatic:No history of coagulopathy or recent blood thinner use    Objective:   Blood pressure 97/61, pulse 89, weight 168 lb (76.2 kg), last menstrual period 02/22/2019. CONSTITUTIONAL: Well-developed, well-nourished female in no acute distress.  HENT:  Normocephalic, atraumatic, External right and left ear normal. Oropharynx is clear and moist EYES: Conjunctivae and EOM are normal. Pupils are equal, round, and reactive to light. No scleral icterus.  NECK: Normal range of motion, supple, no masses SKIN: Skin is warm and dry. No rash noted. Not diaphoretic. No erythema. No pallor. NEUROLOGIC: Alert and oriented to person, place, and time. Normal reflexes, muscle tone coordination. No cranial nerve deficit noted. PSYCHIATRIC: Normal mood and affect. Normal behavior. Normal judgment and thought content. CARDIOVASCULAR: Normal heart rate noted, regular rhythm RESPIRATORY: Effort and breath sounds normal, no problems with respiration noted ABDOMEN: Soft, nontender, nondistended. PELVIC: Deferred MUSCULOSKELETAL: Normal range of motion. No edema and no tenderness. 2+ distal pulses.    Labs: Lab Results  Component Value Date   WBC 8.5 05/29/2019   HGB 12.0 05/29/2019   HCT 35.4 (L) 05/29/2019   MCV 94.1 05/29/2019   PLT 248 05/29/2019     Imaging Studies: None  Assessment:    1. Supervision of high risk pregnancy in second trimester   2. History of preterm delivery   3. History of pregnancy loss in prior pregnancy, currently pregnant in second trimester   4. History of cerclage, currently pregnant     Plan:    Counseling: Procedure, risks, reasons, benefits and complications (including injury to bladder, rectum, major blood vessel, ureter, bleeding, possibility of transfusion, infection, rupture of membranes) reviewed in detail. Likelihood of success in alleviating the  patient's condition was discussed. Routine postoperative instructions will be reviewed with the patient and her family in detail after surgery.  The patient concurred with the proposed plan, giving informed written consent for the surgery.   Preop testing ordered. Instructions reviewed, including NPO after midnight.      Kameryn Tisdel, MD [facility]   

## 2019-07-02 NOTE — Patient Instructions (Signed)
Cervical Cerclage  Cervical cerclage is a surgical procedure to correct a cervix that opens up and thins out before pregnancy is at term. This is also called cervical insufficiency, or incompetent cervix. This condition can cause labor to start early (prematurely). In this procedure, a health care provider uses stitches (sutures) to sew the cervix shut during pregnancy. Your health care provider may use ultrasound to help guide the procedure and monitor your baby. Ultrasound uses sound waves to take images of your cervix and uterus. The health care provider will assess these images on a monitor in the operating room. Tell a health care provider about:  Any allergies you have, especially any allergies related to prescribed medicine, stitches, or anesthetic medicines.  Any problems you or family members have had with anesthetic medicines.  Any blood disorders you have.  Any surgeries you have had, including prior cervical stitching.  Any medical conditions you have or have had. What are the risks? Generally, this is a safe procedure. However, problems may occur, including:  Infection, such as infection of the cervix or the bag of fluid that surrounds the baby (amniotic sac).  Vaginal bleeding.  Allergic reactions to medicines.  Damage to nearby structures or organs, such as injury to the cervix or tearing of the amniotic sac.  Contractions that come too early, including going into early labor and delivery.  Cervical dystocia. This occurs when the cervix is unable to open normally during labor. What happens before the procedure? Staying hydrated Follow instructions from your health care provider about hydration, which may include:  Up to 2 hours before the procedure - you may continue to drink clear liquids, such as water, clear fruit juice, black coffee, and plain tea.  Eating and drinking restrictions Follow instructions from your health care provider about eating and drinking,  which may include:  8 hours before the procedure - stop eating heavy meals or foods, such as meat, fried foods, or fatty foods.  6 hours before the procedure - stop eating light meals or foods, such as toast or cereal.  6 hours before the procedure - stop drinking milk or drinks that contain milk.  2 hours before the procedure - stop drinking clear liquids. Medicines Ask your health care provider about:  Changing or stopping your regular medicines. This is especially important if you are taking diabetes medicines or blood thinners.  Taking medicines such as aspirin and ibuprofen. These medicines can thin your blood. Do not take these medicines unless your health care provider tells you to take them.  Taking over-the-counter medicines, vitamins, herbs, and supplements. Surgery safety Ask your health care provider:  How your surgery site will be marked.  What steps will be taken to help prevent infection. These may include: ? Removing hair at the surgery site. ? Washing skin with a germ-killing soap. ? Taking antibiotic medicine. General instructions  Do not put on any lotion, deodorant, or perfume.  Remove contact lenses and jewelry.  You may have an exam or testing, including blood or urine tests.  Plan to have someone take you home from the hospital or clinic.  If you will be going home right after the procedure, plan to have someone with you for 24 hours. What happens during the procedure?  An IV will be inserted into one of your veins.  You may be given one or more of the following: ? A medicine to help you relax (sedative). ? A medicine to numb the area (local anesthetic). ?   A medicine to make you fall asleep (general anesthetic). ? A medicine that is injected into your spine to numb the area below and slightly above the injection site (spinal anesthetic).  A lubricated instrument (speculum) will be inserted into your vagina. The speculum will be widened to open the  walls of your vagina so your surgeon can see your cervix.  Your cervix will be grasped and tightly sutured to close it. To do this, your surgeon will stitch a strong band of thread around your cervix, then the thread will be tightened to hold your cervix shut. The procedure may vary among health care providers and hospitals. What happens after the procedure?  Your blood pressure, heart rate, breathing rate, and blood oxygen level will be monitored until you leave the hospital or clinic.  You will be monitored for premature contractions.  You may have light bleeding and mild cramping.  You may have to wear compression stockings. These stockings help to prevent blood clots and reduce swelling in your legs.  If you were given a sedative during the procedure, it can affect you for several hours. Do not drive or operate machinery until your health care provider says that it is safe.  You may be put on bed rest.  You may be given an injection of a hormone (progesterone) to prevent premature contractions. Summary  Cervical cerclage is a surgical procedure in which stitches are used to sew the cervix shut during pregnancy.  Before the procedure, tell your health care provider about your medicines, or medical problems or blood disorders that you have.  This is a safe procedure. However, problems may occur, including infection, bleeding, or premature labor.  Follow all instructions about eating and drinking before the procedure. Plan to have someone drive you home from the hospital or clinic. This information is not intended to replace advice given to you by your health care provider. Make sure you discuss any questions you have with your health care provider. Document Revised: 03/30/2019 Document Reviewed: 01/27/2019 Elsevier Patient Education  2020 [employer]  

## 2019-07-02 NOTE — Progress Notes (Signed)
ROB Consult: Patient presents from midwifery service for consultation for cerclage.  Paitent with h/o preterm delivery x 2 (36 weeks, 27 weeks), followed by term delivery at 38 weeks with cerclage during pregnancy. Patient also with h/o 21 week pregnancy loss.  Doing well today.  Notes that she may have lost some of her mucus plug last week.  Denies vaginal bleeding. Does note some pelvic pressure. Just picked up aspirin yesterday to start. For 17-P injection today.  Notes she received flu vaccine last visit. Discussion had with patient regarding risks/benefits of cerclage placement, optimal timing.  Patient notes she received her last cerclage around this gestational age, would like to have placed again.  Pre-op done today. Will schedule for cervical cerclage placement on 07/05/2019. Discussed procedure for COVID and pre-op testing.

## 2019-07-05 ENCOUNTER — Other Ambulatory Visit: Payer: Self-pay

## 2019-07-05 ENCOUNTER — Ambulatory Visit: Payer: Medicaid Other | Admitting: Anesthesiology

## 2019-07-05 ENCOUNTER — Ambulatory Visit
Admission: RE | Admit: 2019-07-05 | Discharge: 2019-07-05 | Disposition: A | Discharge status: Home / Self Care | Payer: Medicaid Other | Attending: Obstetrics and Gynecology | Admitting: Obstetrics and Gynecology

## 2019-07-05 ENCOUNTER — Encounter: Payer: Self-pay | Admitting: Obstetrics and Gynecology

## 2019-07-05 ENCOUNTER — Encounter
Admission: RE | Disposition: A | Discharge status: Home / Self Care | Payer: Self-pay | Source: Home / Self Care | Attending: Obstetrics and Gynecology

## 2019-07-05 ENCOUNTER — Other Ambulatory Visit
Admission: RE | Admit: 2019-07-05 | Discharge: 2019-07-05 | Disposition: A | Discharge status: Home / Self Care | Payer: Medicaid Other | Source: Ambulatory Visit | Attending: Obstetrics and Gynecology | Admitting: Obstetrics and Gynecology

## 2019-07-05 DIAGNOSIS — O99332 Smoking (tobacco) complicating pregnancy, second trimester: Secondary | ICD-10-CM | POA: Insufficient documentation

## 2019-07-05 DIAGNOSIS — Z3A18 18 weeks gestation of pregnancy: Secondary | ICD-10-CM | POA: Diagnosis not present

## 2019-07-05 DIAGNOSIS — O09212 Supervision of pregnancy with history of pre-term labor, second trimester: Secondary | ICD-10-CM | POA: Diagnosis not present

## 2019-07-05 DIAGNOSIS — Z79899 Other long term (current) drug therapy: Secondary | ICD-10-CM | POA: Insufficient documentation

## 2019-07-05 DIAGNOSIS — Z20822 Contact with and (suspected) exposure to covid-19: Secondary | ICD-10-CM | POA: Diagnosis not present

## 2019-07-05 DIAGNOSIS — O0992 Supervision of high risk pregnancy, unspecified, second trimester: Secondary | ICD-10-CM | POA: Insufficient documentation

## 2019-07-05 DIAGNOSIS — Z7982 Long term (current) use of aspirin: Secondary | ICD-10-CM | POA: Diagnosis not present

## 2019-07-05 DIAGNOSIS — F17210 Nicotine dependence, cigarettes, uncomplicated: Secondary | ICD-10-CM | POA: Diagnosis not present

## 2019-07-05 DIAGNOSIS — O09292 Supervision of pregnancy with other poor reproductive or obstetric history, second trimester: Secondary | ICD-10-CM | POA: Diagnosis not present

## 2019-07-05 DIAGNOSIS — Z9889 Other specified postprocedural states: Secondary | ICD-10-CM

## 2019-07-05 DIAGNOSIS — O3432 Maternal care for cervical incompetence, second trimester: Secondary | ICD-10-CM | POA: Insufficient documentation

## 2019-07-05 DIAGNOSIS — O09299 Supervision of pregnancy with other poor reproductive or obstetric history, unspecified trimester: Secondary | ICD-10-CM

## 2019-07-05 DIAGNOSIS — Z8751 Personal history of pre-term labor: Secondary | ICD-10-CM

## 2019-07-05 HISTORY — PX: CERVICAL CERCLAGE: SHX1329

## 2019-07-05 LAB — RESPIRATORY PANEL BY RT PCR (FLU A&B, COVID)
Influenza A by PCR: NEGATIVE
Influenza B by PCR: NEGATIVE
SARS Coronavirus 2 by RT PCR: NEGATIVE

## 2019-07-05 SURGERY — CERCLAGE, CERVIX, VAGINAL APPROACH
Anesthesia: Spinal

## 2019-07-05 MED ORDER — FENTANYL CITRATE (PF) 100 MCG/2ML IJ SOLN
INTRAMUSCULAR | Status: AC
  Filled 2019-07-05: qty 2

## 2019-07-05 MED ORDER — MIDAZOLAM HCL 2 MG/2ML IJ SOLN
INTRAMUSCULAR | Status: AC
  Filled 2019-07-05: qty 2

## 2019-07-05 MED ORDER — LIDOCAINE HCL 1 % IJ SOLN
INTRAMUSCULAR | Status: DC | PRN
  Administered 2019-07-05: 10 mL

## 2019-07-05 MED ORDER — FENTANYL CITRATE (PF) 100 MCG/2ML IJ SOLN
25.0000 ug | INTRAMUSCULAR | Status: AC | PRN
  Administered 2019-07-05 (×3): 25 ug via INTRAVENOUS

## 2019-07-05 MED ORDER — MIDAZOLAM HCL 5 MG/5ML IJ SOLN
INTRAMUSCULAR | Status: DC | PRN
  Administered 2019-07-05 (×2): .5 mg via INTRAVENOUS
  Administered 2019-07-05: 1 mg via INTRAVENOUS

## 2019-07-05 MED ORDER — ACETAMINOPHEN 500 MG PO TABS: 1000.0000 mg | ORAL_TABLET | ORAL | Status: AC

## 2019-07-05 MED ORDER — ACETAMINOPHEN 325 MG PO TABS
ORAL_TABLET | ORAL | Status: AC
  Administered 2019-07-05: 975 mg via ORAL
  Filled 2019-07-05: qty 3

## 2019-07-05 MED ORDER — BUPIVACAINE IN DEXTROSE 0.75-8.25 % IT SOLN
INTRATHECAL | Status: DC | PRN
  Administered 2019-07-05: 1 mL via INTRATHECAL

## 2019-07-05 MED ORDER — LIDOCAINE HCL (PF) 1 % IJ SOLN
INTRAMUSCULAR | Status: AC
  Filled 2019-07-05: qty 30

## 2019-07-05 MED ORDER — LACTATED RINGERS IV SOLN: INTRAVENOUS | Status: DC

## 2019-07-05 MED ORDER — FENTANYL CITRATE (PF) 100 MCG/2ML IJ SOLN
INTRAMUSCULAR | Status: AC
  Administered 2019-07-05: 25 ug via INTRAVENOUS
  Filled 2019-07-05: qty 2

## 2019-07-05 MED ORDER — ACETAMINOPHEN ER 650 MG PO TBCR: 650.0000 mg | EXTENDED_RELEASE_TABLET | Freq: Three times a day (TID) | ORAL | 1 refills | Status: DC | PRN

## 2019-07-05 SURGICAL SUPPLY — 17 items
CATH ROBINSON RED A/P 16FR (CATHETERS) ×3 IMPLANT
COVER WAND RF STERILE (DRAPES) IMPLANT
DRAPE PERI LITHO V/GYN (MISCELLANEOUS) ×3 IMPLANT
DRAPE UNDER BUTTOCK W/FLU (DRAPES) ×3 IMPLANT
ELECT REM PT RETURN 9FT ADLT (ELECTROSURGICAL) ×3
ELECTRODE REM PT RTRN 9FT ADLT (ELECTROSURGICAL) ×1 IMPLANT
GLOVE BIO SURGEON STRL SZ 6.5 (GLOVE) ×2 IMPLANT
GLOVE BIO SURGEONS STRL SZ 6.5 (GLOVE) ×1
GLOVE INDICATOR 7.0 STRL GRN (GLOVE) ×3 IMPLANT
GOWN STRL REUS W/ TWL LRG LVL3 (GOWN DISPOSABLE) ×1 IMPLANT
GOWN STRL REUS W/TWL LRG LVL3 (GOWN DISPOSABLE) ×2
KIT TURNOVER CYSTO (KITS) ×3 IMPLANT
LABEL OR SOLS (LABEL) ×3 IMPLANT
PACK BASIN MINOR ARMC (MISCELLANEOUS) ×3 IMPLANT
PAD OB MATERNITY 4.3X12.25 (PERSONAL CARE ITEMS) ×3 IMPLANT
PAD PREP 24X41 OB/GYN DISP (PERSONAL CARE ITEMS) ×3 IMPLANT
SUT ETHIBOND NAB CT1 #1 30IN (SUTURE) ×6 IMPLANT

## 2019-07-05 NOTE — Op Note (Signed)
Procedure(s): CERCLAGE CERVICAL Procedure Note  Kimberly Wyatt female 29 y.o. 07/05/2019  Indications: The patient is a 29 y.o. [phone] female at [redacted]w[redacted]d gestation, Estimated Date of Delivery: 12/06/19 with history of pregnancy loss (21 weeks), history of preterm delivery x 2 , h/o cerclage placement in prior pregnancy, history of cervical incompetence  Pre-operative Diagnosis: SIUP at [redacted] weeks gestation, history of pregnancy loss (21 weeks), history of preterm delivery x 2 , h/o cerclage placement in prior pregnancy, history of cervical incompetence  Post-operative Diagnosis: Same  Surgeon: Hildred Laser, MD  Assistants:  None  Anesthesia: Spinal anesthesia  Findings:  Gravid uterus, ~ [redacted] weeks gestation Fetal heart tones pre-procedure was 151 bpm.  Cervix with incompetency, ~ 0.5 cm visibly dilated with visualization of amniotic membranes.   Procedure Details: The patient was seen in the Holding Room. The risks, benefits, complications, treatment options, and expected outcomes were discussed with the patient.  The patient concurred with the proposed plan, giving informed consent.  The site of surgery properly noted/marked. The patient was taken to the Operating Room, identified as [name] and the procedure verified as Procedure(s) (LRB): CERCLAGE CERVICAL (N/A). A Time Out was held and the above information confirmed.  She was then placed under spinal anesthesia without difficulty. She was placed in the dorsal lithotomy position, and was prepped and draped in a sterile manner.  A straight catheterization was performed. A sterile weighted speculum was then placed into the vagina.  A Sims retractor was used to elevate the anterior vaginal wall and identify the cervix.  Two Alice clamps were then used to grasp the anterior and posterior aspects of the cervix.  The first bite of the McDonald stitch was placed at the 12 o'clock position on the cervix at the junction of the vaginal  mucosa and portion of the cervix at the level of the internal os using a Nylon 2-0 suture. Repeated pursestring sutures are placed in 5 bites around the cervix at the level of the internal os. The suture strings were then tied, with approximately 4-6 knots placed for easy visualization. The Alice clamps were then removed from the patient's cervix.  The cervix was then injected with ~ 10 ml of 1% Licodaine circumferentially for additional pain control.   The patient was taken to the recovery room in stable condition.  All instrument and sponge counts were correct x 2 at the close of the case.   Estimated Blood Loss:  5 ml      Drains: straight catheterization prior to procedure with  10 ml of clear urine         Total IV Fluids: 600 ml  Specimens: None         Implants: None         Complications:  None; patient tolerated the procedure well.         Disposition: PACU - hemodynamically stable.         Condition: stable   Hildred Laser, MD [facility]

## 2019-07-05 NOTE — Transfer of Care (Signed)
Immediate Anesthesia Transfer of Care Note  Patient: Kimberly Wyatt  Procedure(s) Performed: CERCLAGE CERVICAL (N/A )  Patient Location: PACU  Anesthesia Type:Spinal  Level of Consciousness: awake, alert  and oriented  Airway & Oxygen Therapy: Patient Spontanous Breathing  Post-op Assessment: Report given to RN and Post -op Vital signs reviewed and stable  Post vital signs: Reviewed and stable  Last Vitals:  Vitals Value Taken Time  BP 103/90 07/05/19 1138  Temp    Pulse    Resp 20 07/05/19 1138  SpO2 100 % 07/05/19 1138    Last Pain:  Vitals:   07/05/19 0952  TempSrc: Tympanic  PainSc: 0-No pain         Complications: No apparent anesthesia complications

## 2019-07-05 NOTE — OR Nursing (Signed)
Fetal heart tones obtained by Jacki Cones from labor and delivery. Fetal HR is 154.

## 2019-07-05 NOTE — Anesthesia Procedure Notes (Signed)
Spinal  Patient location during procedure: OR End time: 07/05/2019 10:44 AM Staffing Performed: resident/CRNA  Anesthesiologist: Tera Mater, MD Resident/CRNA: Jonna Clark, CRNA Preanesthetic Checklist Completed: patient identified, IV checked, site marked, risks and benefits discussed, surgical consent, monitors and equipment checked, pre-op evaluation and timeout performed Spinal Block Patient position: sitting Prep: Betadine Patient monitoring: heart rate, continuous pulse ox, blood pressure and cardiac monitor Approach: midline Location: L4-5 Injection technique: single-shot Needle Needle type: Whitacre and Introducer  Needle gauge: 24 G Needle length: 9 cm Additional Notes Negative paresthesia. Negative blood return. Positive free-flowing CSF. Expiration date of kit checked and confirmed. Patient tolerated procedure well, without complications.

## 2019-07-05 NOTE — Discharge Instructions (Signed)

## 2019-07-05 NOTE — Anesthesia Preprocedure Evaluation (Signed)
Anesthesia Evaluation  Patient identified by MRN, date of birth, ID band Patient awake    Reviewed: Allergy & Precautions, H&P , NPO status , Patient's Chart, lab work & pertinent test results  Airway Mallampati: II  TM Distance: >3 FB Neck ROM: full    Dental  (+) Teeth Intact   Pulmonary Current Smoker,           Cardiovascular Exercise Tolerance: Good (-) hypertensionnegative cardio ROS       Neuro/Psych    GI/Hepatic negative GI ROS,   Endo/Other    Renal/GU   negative genitourinary   Musculoskeletal   Abdominal   Peds  Hematology negative hematology ROS (+)   Anesthesia Other Findings History reviewed. No pertinent past medical history.  Past Surgical History: 2020: CERVICAL CERCLAGE     Comment:  removed at [redacted] weeks pregnant No date: no surgery history     Reproductive/Obstetrics (+) Pregnancy                             Anesthesia Physical Anesthesia Plan  ASA: II  Anesthesia Plan: Spinal   Post-op Pain Management:    Induction:   PONV Risk Score and Plan:   Airway Management Planned:   Additional Equipment:   Intra-op Plan:   Post-operative Plan:   Informed Consent: I have reviewed the patients History and Physical, chart, labs and discussed the procedure including the risks, benefits and alternatives for the proposed anesthesia with the patient or authorized representative who has indicated his/her understanding and acceptance.     Dental Advisory Given  Plan Discussed with: Anesthesiologist  Anesthesia Plan Comments:         Anesthesia Quick Evaluation

## 2019-07-05 NOTE — Interval H&P Note (Signed)
History and Physical Interval Note:  07/05/2019 10:12 AM  Kimberly Wyatt  has presented today for surgery, with the diagnosis of HX of preterm delivery Hx of pregnancy loss in prior preg, cervical incompetency.  The various methods of treatment have been discussed with the patient and family. After consideration of risks, benefits and other options for treatment, the patient has consented to  Procedure(s): CERCLAGE CERVICAL (N/A) as a surgical intervention.  The patient's history has been reviewed, patient examined, no change in status, stable for surgery.  I have reviewed the patient's chart and labs.  Questions were answered to the patient's satisfaction.     Hildred Laser, MD [facility]

## 2019-07-05 NOTE — Progress Notes (Signed)
Kimberly Wyatt comes to do fetal heart tones @ 1216. The Fetal heart tones were 155.

## 2019-07-06 NOTE — Anesthesia Postprocedure Evaluation (Signed)
Anesthesia Post Note  Patient: Kimberly Wyatt  Procedure(s) Performed: CERCLAGE CERVICAL (N/A )  Patient location during evaluation: PACU Anesthesia Type: Spinal Level of consciousness: awake and alert Pain management: pain level controlled Vital Signs Assessment: post-procedure vital signs reviewed and stable Respiratory status: spontaneous breathing, nonlabored ventilation and respiratory function stable Cardiovascular status: blood pressure returned to baseline and stable Postop Assessment: no apparent nausea or vomiting, spinal receding and able to ambulate Anesthetic complications: no     Last Vitals:  Vitals:   07/05/19 1329 07/05/19 1435  BP: 110/71 108/67  Pulse: 77 78  Resp: 18 16  Temp: 36.5 C   SpO2: 99% 100%    Last Pain:  Vitals:   07/05/19 1435  TempSrc:   PainSc: 0-No pain                 Aurelio Brash Naeem Quillin

## 2019-07-14 ENCOUNTER — Encounter: Payer: Self-pay | Admitting: Obstetrics and Gynecology

## 2019-07-14 ENCOUNTER — Other Ambulatory Visit: Payer: Self-pay

## 2019-07-14 ENCOUNTER — Ambulatory Visit (INDEPENDENT_AMBULATORY_CARE_PROVIDER_SITE_OTHER): Payer: Medicaid Other | Admitting: Obstetrics and Gynecology

## 2019-07-14 VITALS — BP 110/63 | HR 82 | Wt 168.0 lb

## 2019-07-14 DIAGNOSIS — O09212 Supervision of pregnancy with history of pre-term labor, second trimester: Secondary | ICD-10-CM

## 2019-07-14 DIAGNOSIS — O3432 Maternal care for cervical incompetence, second trimester: Secondary | ICD-10-CM

## 2019-07-14 DIAGNOSIS — Z4889 Encounter for other specified surgical aftercare: Secondary | ICD-10-CM

## 2019-07-14 DIAGNOSIS — O0992 Supervision of high risk pregnancy, unspecified, second trimester: Secondary | ICD-10-CM | POA: Diagnosis not present

## 2019-07-14 DIAGNOSIS — O09292 Supervision of pregnancy with other poor reproductive or obstetric history, second trimester: Secondary | ICD-10-CM

## 2019-07-14 DIAGNOSIS — Z9889 Other specified postprocedural states: Secondary | ICD-10-CM

## 2019-07-14 DIAGNOSIS — O09299 Supervision of pregnancy with other poor reproductive or obstetric history, unspecified trimester: Secondary | ICD-10-CM

## 2019-07-14 DIAGNOSIS — Z8751 Personal history of pre-term labor: Secondary | ICD-10-CM

## 2019-07-14 LAB — POCT URINALYSIS DIPSTICK OB
Bilirubin, UA: NEGATIVE
Glucose, UA: NEGATIVE
Leukocytes, UA: NEGATIVE
Nitrite, UA: NEGATIVE
Spec Grav, UA: 1.02 (ref 1.010–1.025)
Urobilinogen, UA: 0.2 E.U./dL
pH, UA: 7 (ref 5.0–8.0)

## 2019-07-14 MED ORDER — HYDROXYPROGESTERONE CAPROATE 250 MG/ML IM OIL
250.0000 mg | TOPICAL_OIL | Freq: Once | INTRAMUSCULAR | Status: AC
  Administered 2019-07-14: 250 mg via INTRAMUSCULAR

## 2019-07-14 NOTE — Progress Notes (Signed)
ROB-Pt [redacted]w[redacted]d present for post op after cerclage. Pt stated that she was doing well. 17 P injection given today.

## 2019-07-14 NOTE — Progress Notes (Signed)
    OBSTETRICS/GYNECOLOGY POST-OPERATIVE CLINIC VISIT  Subjective:     Kimberly Wyatt is a 29 y.o. female who presents to the clinic 1 weeks status post cervical cerclage placement for history of preterm delivery x 3, with likely cervical incompetency.  H/o cervical cerclage in last pregnancy. Eating a regular diet without difficulty. Feels active fetal movement. Denies vaginal bleeding or foul smelling discharge. Does note occasionally a mucoid discharge and cramping, but otherwise doing well.    The following portions of the patient's history were reviewed and updated as appropriate: allergies, current medications, past family history, past medical history, past social history, past surgical history and problem list.  Review of Systems Pertinent items noted in HPI and remainder of comprehensive ROS otherwise negative.    Objective:    BP 110/63   Pulse 82   Wt 168 lb (76.2 kg)   LMP 02/22/2019   BMI 28.84 kg/m  General:  alert and no distress  Abdomen: soft, bowel sounds active, non-tender. FHT 159 bpm.  FH 19 cm.   Pelvis: :   deferred.     Assessment:    1. Postoperative visit   2. Supervision of high risk pregnancy in second trimester   3. History of preterm delivery   4. History of pregnancy loss in prior pregnancy, currently pregnant in second trimester   5. History of cerclage, currently pregnant   6. Cervical cerclage suture present in second trimester    Plan:   1. Doing well post-operatively. Continue any current medications as needed.  2. Advised to continue pelvic rest until at least [redacted] weeks gestation. No excessive lifting, bending, or straining.  3. Operative findings again reviewed. 4. Due for anatomy scan in 1 week, will also check cervical length.  For 17-P injection today.  Continue weekly injections. Follow up: 4 weeks for ROB visit with Dr. Josie Dixon, MD [facility]

## 2019-07-20 ENCOUNTER — Ambulatory Visit (INDEPENDENT_AMBULATORY_CARE_PROVIDER_SITE_OTHER): Payer: Medicaid Other | Admitting: Certified Nurse Midwife

## 2019-07-20 ENCOUNTER — Encounter: Payer: Self-pay | Admitting: Certified Nurse Midwife

## 2019-07-20 ENCOUNTER — Ambulatory Visit (INDEPENDENT_AMBULATORY_CARE_PROVIDER_SITE_OTHER): Payer: Medicaid Other

## 2019-07-20 ENCOUNTER — Other Ambulatory Visit: Payer: Self-pay

## 2019-07-20 ENCOUNTER — Ambulatory Visit: Payer: Medicaid Other

## 2019-07-20 VITALS — BP 100/57 | HR 91 | Wt 170.1 lb

## 2019-07-20 DIAGNOSIS — Z363 Encounter for antenatal screening for malformations: Secondary | ICD-10-CM

## 2019-07-20 DIAGNOSIS — O09892 Supervision of other high risk pregnancies, second trimester: Secondary | ICD-10-CM

## 2019-07-20 DIAGNOSIS — O3432 Maternal care for cervical incompetence, second trimester: Secondary | ICD-10-CM

## 2019-07-20 DIAGNOSIS — Z3A20 20 weeks gestation of pregnancy: Secondary | ICD-10-CM

## 2019-07-20 DIAGNOSIS — O09212 Supervision of pregnancy with history of pre-term labor, second trimester: Secondary | ICD-10-CM

## 2019-07-20 DIAGNOSIS — Z3482 Encounter for supervision of other normal pregnancy, second trimester: Secondary | ICD-10-CM

## 2019-07-20 MED ORDER — HYDROXYPROGESTERONE CAPROATE 250 MG/ML IM OIL
250.0000 mg | TOPICAL_OIL | Freq: Once | INTRAMUSCULAR | Status: AC
  Administered 2019-07-20: 250 mg via INTRAMUSCULAR

## 2019-07-20 NOTE — Patient Instructions (Signed)

## 2019-07-20 NOTE — Progress Notes (Signed)
ROB doing well. Feels movement. Anatomy u/s today (see below) results reviewed with pt. Pt state she has an appointment scheduled with Dr. Logan Bores for her next visit. Follow up as scheduled.   Doreene Burke, CNM   Patient Name: Kimberly Wyatt DOB: 04-29-1991 MRN: [id number] ULTRASOUND REPORT  Location: [facility] Date of Service: 07/20/2019   Indications:Anatomy Ultrasound Findings:  Mason Jim intrauterine pregnancy is visualized with FHR at 136 BPM. Biometrics give an (U/S) Gestational age of [redacted]w[redacted]d and an (U/S) EDD of 12/03/2019; this correlates with the clinically established Estimated Date of Delivery: 12/06/19  Fetal presentation is varitable.  EFW: 361  g (13 oz).  Placenta: anterior. Grade: 1 AFI: subjectively normal.  Anatomic survey is complete and normal; Gender - female.    Right Ovary is normal in appearance. Left Ovary is normal appearance. Survey of the adnexa demonstrates no adnexal masses. There is no free peritoneal fluid in the cul de sac.  Impression: 1. [redacted]w[redacted]d Viable Singleton Intrauterine pregnancy by U/S. 2. (U/S) EDD is consistent with Clinically established Estimated Date of Delivery: 12/06/19 . 3. Normal Anatomy Scan  Recommendations: 1.Clinical correlation with the patient's History and Physical Exam.   Jenine M. Marciano Sequin    RDMS

## 2019-07-20 NOTE — Addendum Note (Signed)
Addended by: Brooke Dare on: 07/20/2019 04:05 PM   Modules accepted: Orders

## 2019-07-27 ENCOUNTER — Ambulatory Visit: Payer: Medicaid Other

## 2019-08-02 ENCOUNTER — Emergency Department: Admission: EM | Admit: 2019-08-02 | Payer: Self-pay | Admitting: Obstetrics and Gynecology

## 2019-08-03 ENCOUNTER — Ambulatory Visit (HOSPITAL_COMMUNITY)
Admission: AD | Admit: 2019-08-03 | Discharge: 2019-08-03 | Disposition: A | Discharge status: Home / Self Care | Payer: Medicaid Other | Source: Other Acute Inpatient Hospital | Attending: Emergency Medicine | Admitting: Emergency Medicine

## 2019-08-03 ENCOUNTER — Inpatient Hospital Stay
Admission: RE | Admit: 2019-08-03 | Discharge: 2019-08-03 | DRG: 833 | Disposition: A | Discharge status: Other Acute Inpatient Hospital | Payer: Medicaid Other | Attending: Certified Nurse Midwife | Admitting: Certified Nurse Midwife

## 2019-08-03 ENCOUNTER — Other Ambulatory Visit: Payer: Self-pay

## 2019-08-03 ENCOUNTER — Encounter: Payer: Self-pay | Admitting: Obstetrics and Gynecology

## 2019-08-03 DIAGNOSIS — Z20822 Contact with and (suspected) exposure to covid-19: Secondary | ICD-10-CM | POA: Diagnosis present

## 2019-08-03 DIAGNOSIS — O2690 Pregnancy related conditions, unspecified, unspecified trimester: Secondary | ICD-10-CM | POA: Diagnosis present

## 2019-08-03 DIAGNOSIS — O09212 Supervision of pregnancy with history of pre-term labor, second trimester: Secondary | ICD-10-CM

## 2019-08-03 DIAGNOSIS — Z3A00 Weeks of gestation of pregnancy not specified: Secondary | ICD-10-CM | POA: Diagnosis not present

## 2019-08-03 DIAGNOSIS — O3432 Maternal care for cervical incompetence, second trimester: Secondary | ICD-10-CM

## 2019-08-03 DIAGNOSIS — Z3A22 22 weeks gestation of pregnancy: Secondary | ICD-10-CM

## 2019-08-03 HISTORY — DX: Other specified health status: Z78.9

## 2019-08-03 LAB — RUPTURE OF MEMBRANE (ROM)PLUS: Rom Plus: NEGATIVE

## 2019-08-03 LAB — URINE DRUG SCREEN, QUALITATIVE (ARMC ONLY)
Amphetamines, Ur Screen: NOT DETECTED
Barbiturates, Ur Screen: NOT DETECTED
Benzodiazepine, Ur Scrn: NOT DETECTED
Cannabinoid 50 Ng, Ur ~~LOC~~: NOT DETECTED
Cocaine Metabolite,Ur ~~LOC~~: NOT DETECTED
MDMA (Ecstasy)Ur Screen: NOT DETECTED
Methadone Scn, Ur: NOT DETECTED
Opiate, Ur Screen: NOT DETECTED
Phencyclidine (PCP) Ur S: NOT DETECTED
Tricyclic, Ur Screen: NOT DETECTED

## 2019-08-03 LAB — CULTURE, OB URINE: Culture: <10000 — AB

## 2019-08-03 LAB — RESPIRATORY PANEL BY RT PCR (FLU A&B, COVID)
Influenza A by PCR: NEGATIVE
Influenza B by PCR: NEGATIVE
SARS Coronavirus 2 by RT PCR: NEGATIVE

## 2019-08-03 MED ORDER — TERBUTALINE SULFATE 1 MG/ML IJ SOLN
INTRAMUSCULAR | Status: AC
  Administered 2019-08-03: 02:00:00 1 mg
  Filled 2019-08-03: qty 1

## 2019-08-03 MED ORDER — OXYTOCIN BOLUS FROM INFUSION: 500.0000 mL | Freq: Once | INTRAVENOUS | Status: DC

## 2019-08-03 MED ORDER — LIDOCAINE HCL (PF) 1 % IJ SOLN: 30.0000 mL | INTRAMUSCULAR | Status: DC | PRN

## 2019-08-03 MED ORDER — LACTATED RINGERS IV SOLN: INTRAVENOUS | Status: DC

## 2019-08-03 MED ORDER — BUTORPHANOL TARTRATE 1 MG/ML IJ SOLN: 1.0000 mg | Freq: Once | INTRAMUSCULAR | Status: AC

## 2019-08-03 MED ORDER — LACTATED RINGERS IV BOLUS
1000.0000 mL | Freq: Once | INTRAVENOUS | Status: AC
  Administered 2019-08-03: 01:00:00 1000 mL via INTRAVENOUS

## 2019-08-03 MED ORDER — SODIUM CHLORIDE 0.9 % IV SOLN
INTRAVENOUS | Status: AC
  Filled 2019-08-03: qty 2000

## 2019-08-03 MED ORDER — TERBUTALINE SULFATE 1 MG/ML IJ SOLN
0.2500 mg | Freq: Once | INTRAMUSCULAR | Status: AC
  Administered 2019-08-03: 01:00:00 0.25 mg via SUBCUTANEOUS

## 2019-08-03 MED ORDER — LACTATED RINGERS IV SOLN: 500.0000 mL | INTRAVENOUS | Status: DC | PRN

## 2019-08-03 MED ORDER — SODIUM CHLORIDE 0.9 % IV SOLN
2.0000 g | Freq: Four times a day (QID) | INTRAVENOUS | Status: DC
  Administered 2019-08-03: 2 g via INTRAVENOUS

## 2019-08-03 MED ORDER — BUTORPHANOL TARTRATE 1 MG/ML IJ SOLN
INTRAMUSCULAR | Status: AC
  Administered 2019-08-03: 02:00:00 1 mg via INTRAVENOUS
  Filled 2019-08-03: qty 1

## 2019-08-03 MED ORDER — BUTORPHANOL TARTRATE 1 MG/ML IJ SOLN
INTRAMUSCULAR | Status: AC
  Administered 2019-08-03: 01:00:00 1 mg via INTRAVENOUS
  Filled 2019-08-03: qty 1

## 2019-08-03 MED ORDER — OXYTOCIN 40 UNITS IN NORMAL SALINE INFUSION - SIMPLE MED: 2.5000 [IU]/h | INTRAVENOUS | Status: DC

## 2019-08-03 NOTE — Discharge Summary (Signed)
                              Discharge Summary  Date of Admission: 08/03/2019  Date of Discharge: 08/03/2019  Admitting Diagnosis: Preterm labor at [redacted]w[redacted]d   Discharge Diagnosis: Preterm labor, transfer of care to high level nursery care   Intrapartum Procedures: IV fluid, IV antibiotics   Complications: none                      Discharge Day SOAP Note:  Progress Note - Vaginal Delivery  Kimberly Wyatt is a 29 y.o. [id number] transferred to [facility] for higher level nursery care.  Subjective  The patient has the following complaints: complains of contractions q 1-3 min, moderated pain.    Objective  Vital signs: BP 125/63   Pulse (!) 117   Temp 98 F (36.7 C)   Resp 18   Ht 5\' 4"  (1.626 m)   Wt 77.1 kg   LMP 02/22/2019   BMI 29.18 kg/m   Physical Exam: Gen: NAD Abdomen: contractions Q 1-3 min FHT: 165 Lungs clear bilaterally HR: RRR SVE 1-2 cm, cerclage present, no bleeding    Data Review Labs: CBC Latest Ref Rng & Units 05/29/2019 05/24/2019 04/04/2019  WBC 4.0 - 10.5 K/uL 8.5 9.0 7.6  Hemoglobin 12.0 - 15.0 g/dL 04/06/2019 86.7 54.4  Hematocrit 36.0 - 46.0 % 35.4(L) 37.3 41.0  Platelets 150 - 400 K/uL 248 242 309   O  Assessment/Plan  Active Problems:   Labor and delivery, indication for care    Plan for transfer to [facility] via EMS.    Medications:IV LR, Ampicillin 2 g  Allergies as of 08/03/2019      Reactions   Mango Flavor Swelling      Medication List    ASK your doctor about these medications   acetaminophen 500 MG tablet Commonly known as: TYLENOL Take 500 mg by mouth every 6 (six) hours as needed.   aspirin EC 81 MG tablet Take 1 tablet (81 mg total) by mouth daily.   prenatal multivitamin Tabs tablet Take 1 tablet by mouth daily at 12 noon.      Outpatient follow up:  Postpartum contraception: Will discuss at first office visit post-partum  Discharged Condition: stable  Discharged to: EMS for trasnport     08/05/2019, CNM    08/03/2019 8:33 AM

## 2019-08-03 NOTE — H&P (Signed)
History and Physical   HPI  Kimberly Wyatt is a 29 y.o. [id number] at [redacted]w[redacted]d Estimated Date of Delivery: 12/06/19 who is being admitted for Preterm labor.    OB History  OB History  Gravida Para Term Preterm AB Living  7 5 2 3 1 3   SAB TAB Ectopic Multiple Live Births  0 1 0 0 5    # Outcome Date GA Lbr Len/2nd Weight Sex Delivery Anes PTL Lv  7 Current           6 Term 08/26/18 [redacted]w[redacted]d  2863 g M Vag-Spont  N LIV     Complications: Premature dilatation of cervix during pregnancy  5 Preterm 03/12/17 [redacted]w[redacted]d  454 g M Vag-Spont  Y ND     Complications: Premature dilatation of cervix during pregnancy  4 Preterm 09/29/13 [redacted]w[redacted]d      Y ND  3 Preterm 05/17/13 [redacted]w[redacted]d  2977 g F Vag-Spont None N LIV     Complications: Premature dilatation of cervix during pregnancy  2 Term 11/15/11 [redacted]w[redacted]d  2977 g M Vag-Spont EPI N LIV     Complications: Premature dilatation of cervix during pregnancy  1 TAB 03/02/08            Obstetric Comments  Cervical cerclage placed in G6 pregnancy    PROBLEM LIST  Pregnancy complications or risks: Patient Active Problem List   Diagnosis Date Noted  . Labor and delivery, indication for care 08/03/2019  . History of preterm delivery 06/29/2019  . History of pregnancy loss in prior pregnancy, currently pregnant in second trimester 06/29/2019  . History of cerclage, currently pregnant 06/29/2019    Prenatal labs and studies: ABO, Rh: O/Positive/-- (11/23 1507) Antibody: Negative (11/23 1507) Rubella: 16.10 (11/23 1507) RPR: Non Reactive (11/23 1507)  HBsAg: Negative (11/23 1507)  HIV: Non Reactive (11/23 1507)  GBS: unknown   Past Medical History:  Diagnosis Date  . Medical history non-contributory      Past Surgical History:  Procedure Laterality Date  . CERVICAL CERCLAGE  2020   removed at [redacted] weeks pregnant  . CERVICAL CERCLAGE N/A 07/05/2019   Procedure: CERCLAGE CERVICAL;  Surgeon: Rubie Maid, MD;  Location: [facility] ORS;  Service: Gynecology;   Laterality: N/A;  . NO PAST SURGERIES    . no surgery history       Medications    Current Discharge Medication List    CONTINUE these medications which have NOT CHANGED   Details  acetaminophen (TYLENOL) 500 MG tablet Take 500 mg by mouth every 6 (six) hours as needed.    aspirin EC 81 MG tablet Take 1 tablet (81 mg total) by mouth daily. Qty: 30 tablet, Refills: 6    Prenatal Vit-Fe Fumarate-FA (PRENATAL MULTIVITAMIN) TABS tablet Take 1 tablet by mouth daily at 12 noon. Qty: 90 tablet, Refills: 3         Allergies  Mango flavor  Review of Systems  Constitutional: negative Eyes: negative Ears, nose, mouth, throat, and face: negative Respiratory: negative Cardiovascular: negative Gastrointestinal: negative Genitourinary:negative Integument/breast: negative Hematologic/lymphatic: negative Musculoskeletal:negative Neurological: negative Behavioral/Psych: negative Endocrine: negative Allergic/Immunologic: negative  Physical Exam  BP 121/63 (BP Location: Right Arm)   Pulse 88   Temp 98 F (36.7 C) (Oral)   Resp 18   Ht 5\' 4"  (1.626 m)   Wt 77.1 kg   LMP 02/22/2019   BMI 29.18 kg/m   Lungs:  CTA B Cardio: RRR  Abd: Soft, gravid, NT Presentation: unknown EXT:  No C/C/ 1+ Edema DTRs: 2+ B CERVIX: Dilation: 1 Effacement (%): 80, 90 Exam by:: [name] CNM  See Prenatal records for more detailed PE.     FHR:  FHT: 165  Toco: Uterine Contractions: Frequency: Every 2 minutes, Duration: 40-60 seconds and Intensity: moderate  Test Results  No results found for this or any previous visit (from the past 24 hour(s)). Other: GBS unknown  Assessment   [id number] at [redacted]w[redacted]d Estimated Date of Delivery: 12/06/19  The fetus is reassuring.   Patient Active Problem List   Diagnosis Date Noted  . Labor and delivery, indication for care 08/03/2019  . History of preterm delivery 06/29/2019  . History of pregnancy loss in prior pregnancy, currently pregnant  in second trimester 06/29/2019  . History of cerclage, currently pregnant 06/29/2019    Plan  1. Admit to L&D :  Dr. Logan Bores attending MD consulted on plan of care 2. EFM:--FHTs present, Toco: ctx q 2 min.  3. Stadol prn for pain  4. Admission labs , UA, urine culture, and drug screen collected 5. Neonatology consult ordered 6. Pt requesting all resuscitative measures be done for baby. [facility] (Dr Wynn Maudlin) consulted, Dr.Denobal accepted transfer of care. pt being transferred to [facility]  7. Ampicillin  IV per MFM recommendations   Doreene Burke, CNM  08/03/2019 1:08 AM

## 2019-08-03 NOTE — OB Triage Note (Signed)
Pt is a 28y/o [id number] at [redacted]w[redacted]d with c/o ctx rating 8/10. Pt states she has been cramping since 5pm on 08/02/19 but it got worse at 2330. Pt states she went the bathroom and had a bowel movement and began to feel more intense contractions like a previous preterm delivery. Pt states some wetness between her legs at 2330 with trickling since. Pt states +FM occasionally. Pt denies VB. Monitors applied and assessing. Initial FHT 160.

## 2019-08-03 NOTE — Progress Notes (Signed)
Pt arrived via EMS and was admitted under wrong pt chart. Registration notified and correct patient found.

## 2019-08-03 NOTE — Final Progress Note (Signed)
Discharge Day SOAP Note:  Progress Note - Vaginal Delivery  Kimberly Wyatt is a 29 y.o. [id number] transferred to [facility] for higher level nursery care.  Subjective  The patient has the following complaints: complains of contractions q 1-3 min, moderated pain.    Objective  Vital signs: BP 125/63   Pulse (!) 117   Temp 98 F (36.7 C)   Resp 18   Ht 5\' 4"  (1.626 m)   Wt 77.1 kg   LMP 02/22/2019   BMI 29.18 kg/m   Physical Exam: Gen: NAD Abdomen: contractions Q 1-3 min FHT: 165 Lungs clear bilaterally HR: RRR SVE 1-2 cm, cerclage present, no bleeding    Data Review Labs: CBC Latest Ref Rng & Units 05/29/2019 05/24/2019 04/04/2019  WBC 4.0 - 10.5 K/uL 8.5 9.0 7.6  Hemoglobin 12.0 - 15.0 g/dL 04/06/2019 44.0 34.7  Hematocrit 36.0 - 46.0 % 35.4(L) 37.3 41.0  Platelets 150 - 400 K/uL 248 242 309   O  Assessment/Plan  Active Problems:   Labor and delivery, indication for care    Plan for transfer to [facility] via EMS.    Medications:IV LR, Ampicillin 2 g  Allergies as of 08/03/2019      Reactions   Mango Flavor Swelling      Medication List    ASK your doctor about these medications   acetaminophen 500 MG tablet Commonly known as: TYLENOL Take 500 mg by mouth every 6 (six) hours as needed.   aspirin EC 81 MG tablet Take 1 tablet (81 mg total) by mouth daily.   prenatal multivitamin Tabs tablet Take 1 tablet by mouth daily at 12 noon.      Outpatient follow up:  Postpartum contraception: Will discuss at first office visit post-partum  Discharged Condition: stable  Discharged to: EMS for trasnport     08/05/2019, CNM  08/03/2019 8:33 AM

## 2019-08-04 ENCOUNTER — Telehealth: Payer: Self-pay

## 2019-08-04 ENCOUNTER — Inpatient Hospital Stay
Admission: RE | Admit: 2019-08-04 | Discharge: 2019-08-04 | Disposition: A | Discharge status: Home / Self Care | Payer: Medicaid Other | Source: Ambulatory Visit

## 2019-08-04 MED ORDER — WITCH HAZEL-GLYCERIN EX PADS
1.00 | MEDICATED_PAD | CUTANEOUS | Status: DC
Start: ? — End: 2019-08-04

## 2019-08-04 MED ORDER — ACETAMINOPHEN 325 MG PO TABS
975.00 | ORAL_TABLET | ORAL | Status: DC
Start: ? — End: 2019-08-04

## 2019-08-04 MED ORDER — ACETAMINOPHEN 325 MG PO TABS
650.00 | ORAL_TABLET | ORAL | Status: DC
Start: ? — End: 2019-08-04

## 2019-08-04 MED ORDER — MISOPROSTOL 200 MCG PO TABS
800.00 | ORAL_TABLET | ORAL | Status: DC
Start: ? — End: 2019-08-04

## 2019-08-04 MED ORDER — GENERIC EXTERNAL MEDICATION
10.00 | Status: DC
Start: ? — End: 2019-08-04

## 2019-08-04 MED ORDER — IBUPROFEN 400 MG PO TABS
400.00 | ORAL_TABLET | ORAL | Status: DC
Start: ? — End: 2019-08-04

## 2019-08-04 MED ORDER — METHYLERGONOVINE MALEATE 0.2 MG/ML IJ SOLN
0.20 | INTRAMUSCULAR | Status: DC
Start: ? — End: 2019-08-04

## 2019-08-04 MED ORDER — ONDANSETRON HCL 4 MG/2ML IJ SOLN
4.00 | INTRAMUSCULAR | Status: DC
Start: ? — End: 2019-08-04

## 2019-08-04 MED ORDER — DSS 100 MG PO CAPS
100.00 | ORAL_CAPSULE | ORAL | Status: DC
Start: 2019-08-04 — End: 2019-08-04

## 2019-08-04 MED ORDER — BENZOCAINE 20 % EX AERO
1.00 | INHALATION_SPRAY | CUTANEOUS | Status: DC
Start: ? — End: 2019-08-04

## 2019-08-04 MED ORDER — SIMETHICONE 80 MG PO CHEW
80.00 | CHEWABLE_TABLET | ORAL | Status: DC
Start: ? — End: 2019-08-04

## 2019-08-04 MED ORDER — ALUM & MAG HYDROXIDE-SIMETH 400-400-40 MG/5ML PO SUSP
15.00 | ORAL | Status: DC
Start: ? — End: 2019-08-04

## 2019-08-04 MED ORDER — IBUPROFEN 600 MG PO TABS
600.00 | ORAL_TABLET | ORAL | Status: DC
Start: ? — End: 2019-08-04

## 2019-08-04 NOTE — Telephone Encounter (Signed)
Called pt to see how she was doing. Pt stated that one of the blood clots was soft, but the other one is hard. Pt stated that when she urinated it was more blood coming out than urine. Pt wants to know if she needs to go to ED. Pictures are attached to [employer].

## 2019-08-09 ENCOUNTER — Telehealth: Payer: Self-pay | Admitting: Obstetrics and Gynecology

## 2019-08-09 NOTE — Telephone Encounter (Signed)
Yes ma'am I will call her and schu a 4 week ppv thank you

## 2019-08-09 NOTE — Telephone Encounter (Signed)
I would prefer to see her at 4 weeks instead for a postpartum visit.

## 2019-08-09 NOTE — Telephone Encounter (Signed)
Rose from the HD called and stated that she talked to the pt and the pt wants to cancel her appt for this week on the 24th the appt is for a f/u from her  MISCARRIAGE.  The pt would just like to be seen at her 6 week ppv. If that is okay I can call the pt and schedule her 6 week ppv. Please advise

## 2019-08-11 ENCOUNTER — Ambulatory Visit: Payer: Medicaid Other | Admitting: Obstetrics and Gynecology

## 2019-08-11 ENCOUNTER — Ambulatory Visit (HOSPITAL_COMMUNITY)
Admission: AD | Admit: 2019-08-11 | Discharge: 2019-08-11 | Disposition: A | Discharge status: Home / Self Care | Payer: Medicaid Other | Source: Other Acute Inpatient Hospital | Attending: Emergency Medicine | Admitting: Emergency Medicine

## 2019-08-31 ENCOUNTER — Encounter: Payer: Medicaid Other | Admitting: Obstetrics and Gynecology

## 2019-12-17 IMAGING — US US OB < 14 WEEKS - US OB TV
1 series · 14 of 28 positions shown · non-contrast
Comparison: None.

CLINICAL DATA: Initial evaluation for acute left lower quadrant
pain, early pregnancy.

EXAM:
OBSTETRIC <14 WK US AND TRANSVAGINAL OB US
TECHNIQUE: Both transabdominal and transvaginal ultrasound examinations were
performed for complete evaluation of the gestation as well as the
maternal uterus, adnexal regions, and pelvic cul-de-sac.
Transvaginal technique was performed to assess early pregnancy.

[Series 1: us ob < 14 weeks - us ob tv · 14 of 85 slices shown]
[im 4/85]
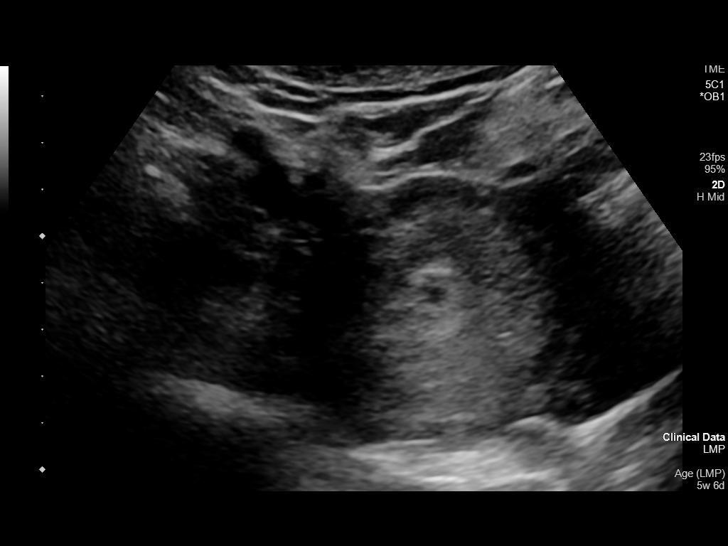
[im 10/85]
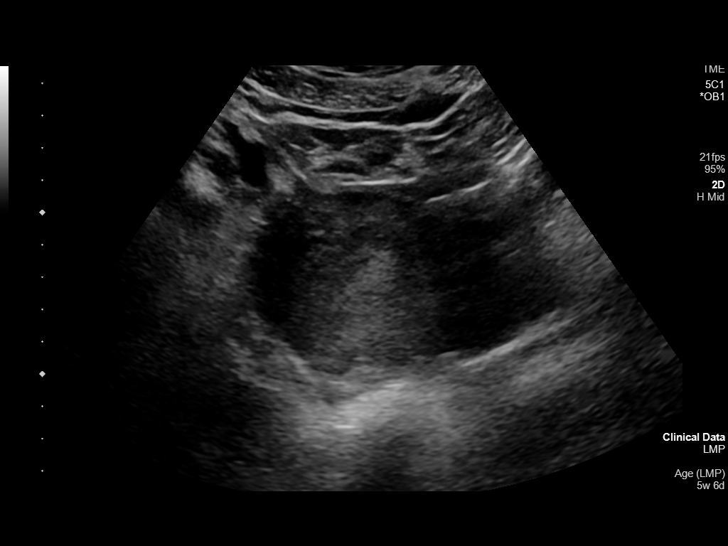
[im 16/85]
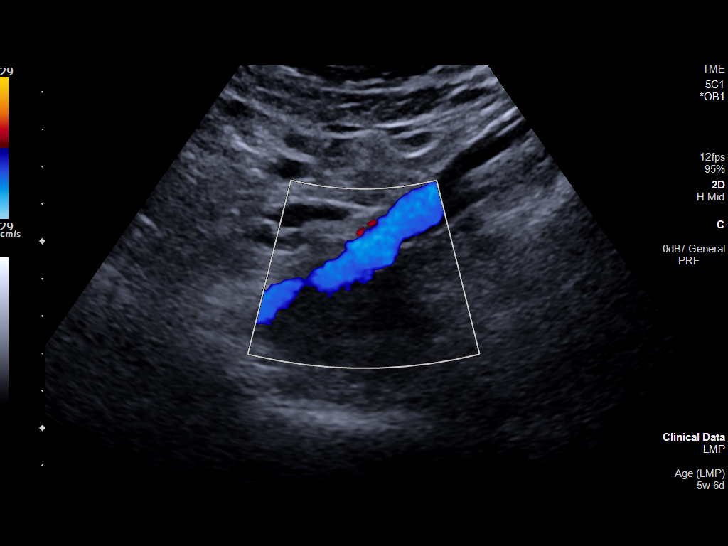
[im 22/85]
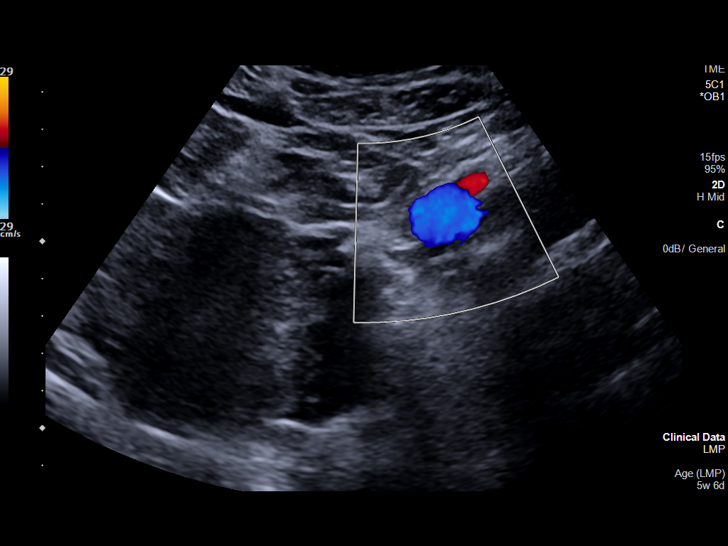
[im 29/85]
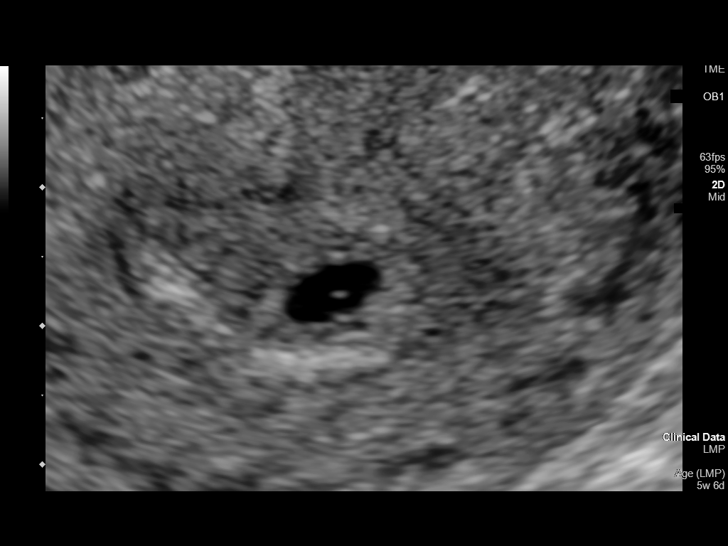
[im 35/85]
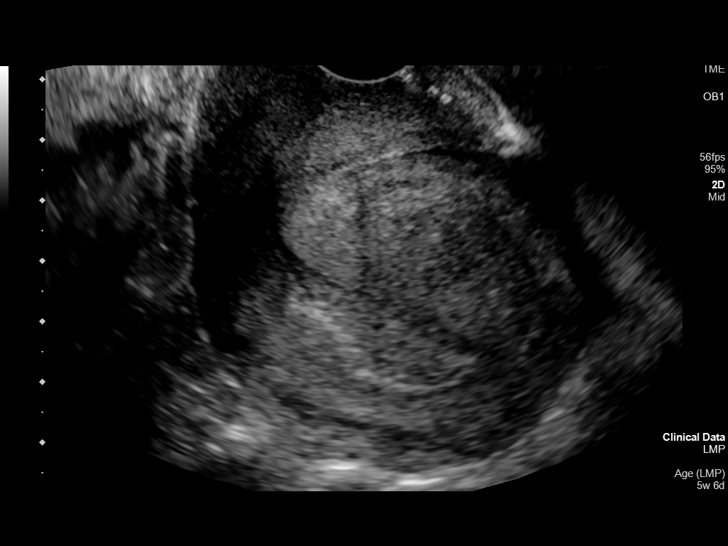
[im 41/85]
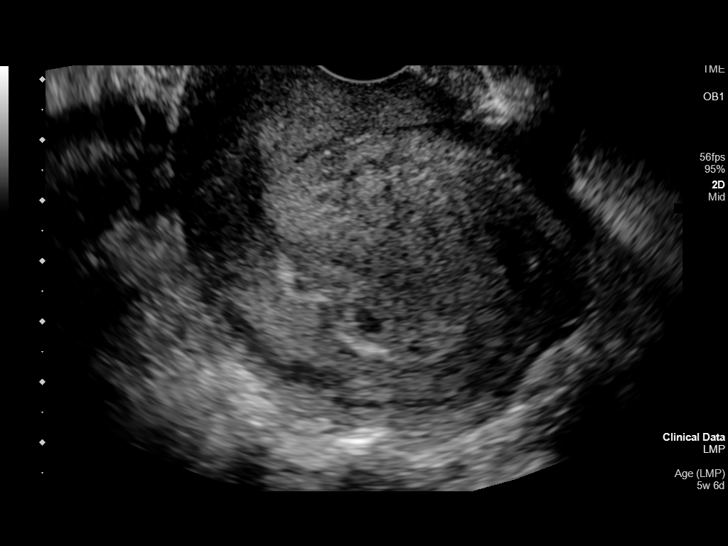
[im 47/85]
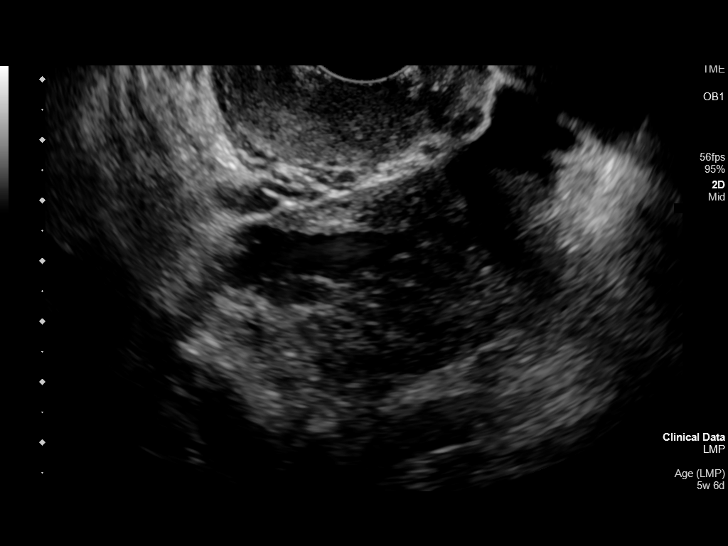
[im 53/85]
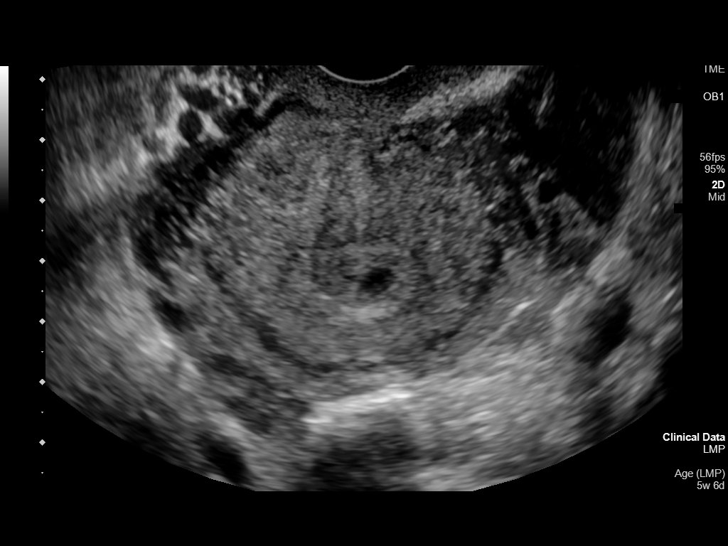
[im 60/85]
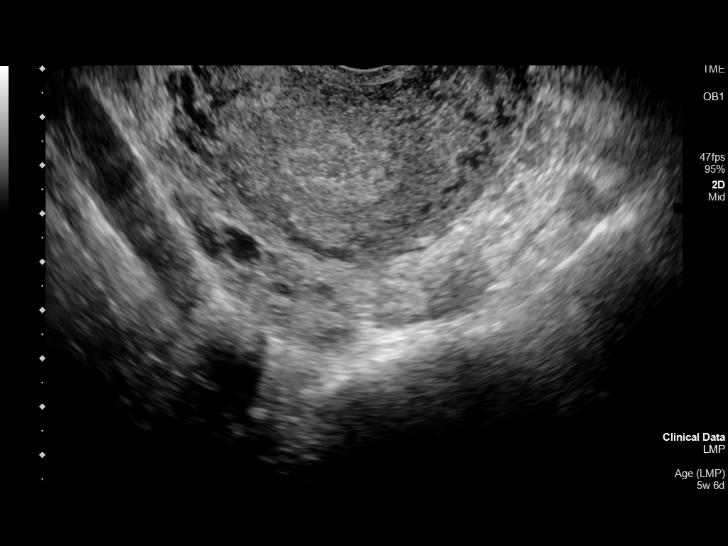
[im 66/85]
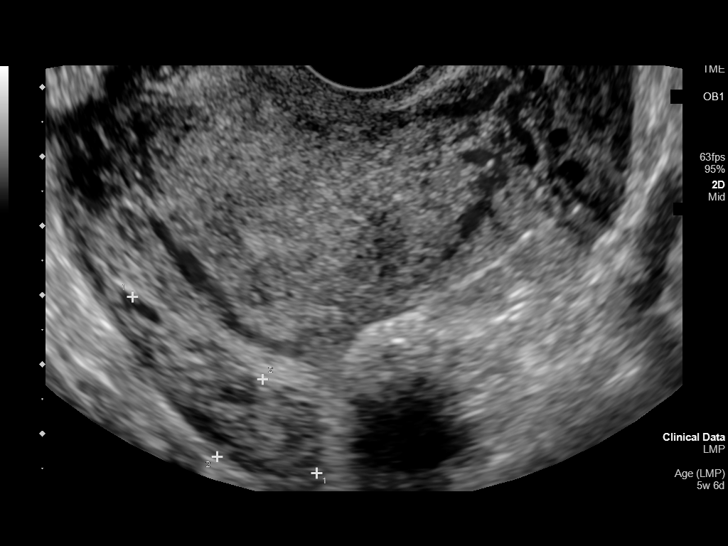
[im 72/85]
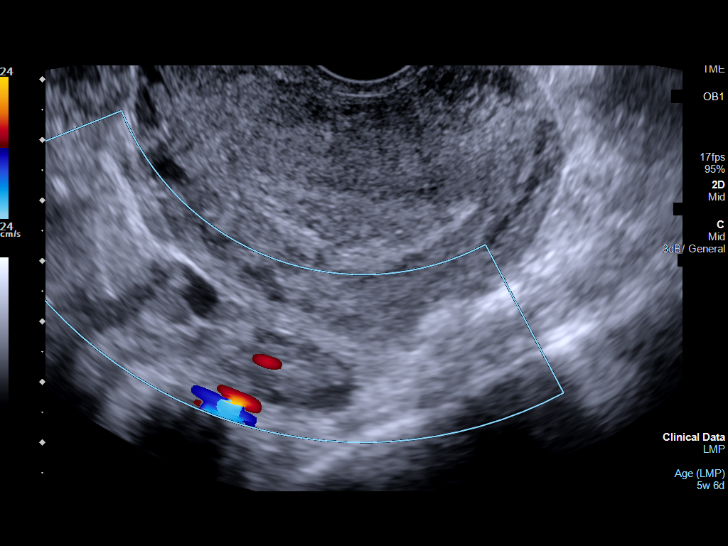
[im 78/85]
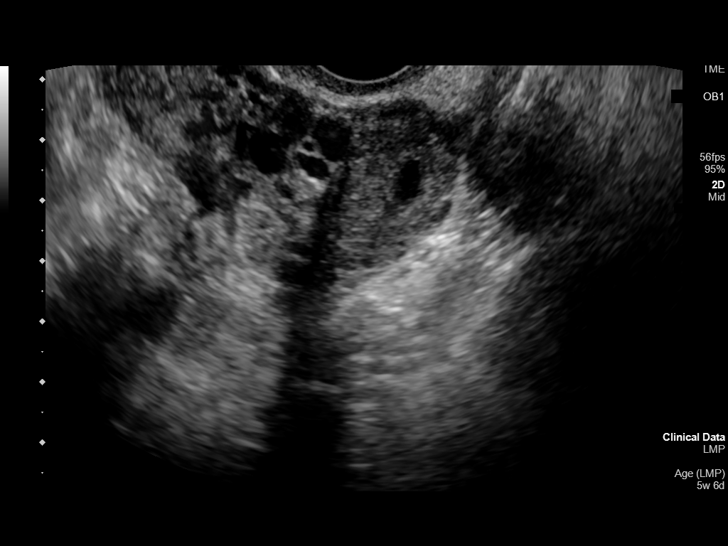
[im 85/85]
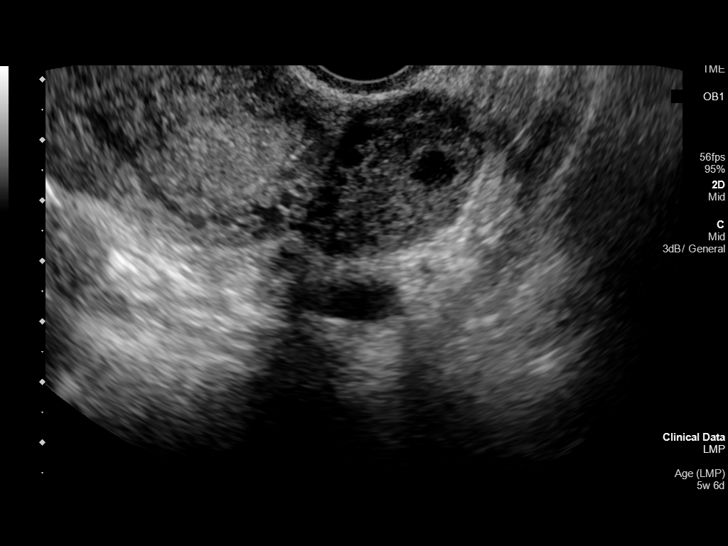

[14 of 28 positions shown; findings below may reference images not displayed]

FINDINGS: Intrauterine gestational sac: Single

Yolk sac:  Present

Embryo:  Not visualized.

Cardiac Activity: Negative.

Heart Rate: N/A  bpm

MSD: 5.5 mm   5 w   2 d

Subchorionic hemorrhage:  None visualized.

Maternal uterus/adnexae: Ovaries are normal in appearance
bilaterally. Corpus luteal cyst noted on the left. No free fluid.
IMPRESSION: 1. Early intrauterine gestational sac with internal yolk sac, but no
fetal pole or cardiac activity yet visualized. Recommend follow-up
quantitative B-HCG levels and follow-up US in 14 days to confirm and
assess viability.
2. Small left ovarian corpus luteal cyst, which could contribute to
left lower quadrant pain.
3. No other acute maternal uterine or adnexal abnormality
identified.

## 2020-03-30 ENCOUNTER — Encounter: Payer: Medicaid Other | Admitting: Obstetrics and Gynecology

## 2020-04-06 ENCOUNTER — Encounter: Payer: Self-pay | Admitting: Obstetrics and Gynecology

## 2020-08-04 ENCOUNTER — Other Ambulatory Visit: Payer: Self-pay | Admitting: Obstetrics and Gynecology

## 2020-08-04 ENCOUNTER — Ambulatory Visit (INDEPENDENT_AMBULATORY_CARE_PROVIDER_SITE_OTHER): Payer: Medicaid Other | Admitting: Obstetrics and Gynecology

## 2020-08-04 ENCOUNTER — Encounter: Payer: Self-pay | Admitting: Obstetrics and Gynecology

## 2020-08-04 ENCOUNTER — Other Ambulatory Visit: Payer: Self-pay

## 2020-08-04 VITALS — BP 131/82 | HR 86 | Ht 64.0 in | Wt 178.9 lb

## 2020-08-04 DIAGNOSIS — O09291 Supervision of pregnancy with other poor reproductive or obstetric history, first trimester: Secondary | ICD-10-CM | POA: Diagnosis not present

## 2020-08-04 DIAGNOSIS — Z202 Contact with and (suspected) exposure to infections with a predominantly sexual mode of transmission: Secondary | ICD-10-CM | POA: Diagnosis not present

## 2020-08-04 DIAGNOSIS — Z3200 Encounter for pregnancy test, result unknown: Secondary | ICD-10-CM

## 2020-08-04 LAB — POCT URINE PREGNANCY: Preg Test, Ur: POSITIVE — AB

## 2020-08-04 NOTE — Progress Notes (Signed)
HPI:      [name] is a 30 y.o. [phone] who LMP was Patient's last menstrual period was 06/28/2020.  Subjective:   She presents today because she believes she may be pregnant and considers herself "high risk".  She has a history of pregnancy loss (21 weeks), history of preterm delivery x 2 , h/o cerclage placement in prior pregnancy, history of cervical incompetence.  In fact her last pregnancy ended at 23 weeks with the patient in preterm labor at which time the cerclage had to be removed for delivery. Patient states that she would like to be tested for vaginal STDs "just to be sure".  She reports she is asymptomatic and has no specific concerns regarding her partner. Patient planning a move to [facility] and will be establishing care there.    Hx: The following portions of the patient's history were reviewed and updated as appropriate:             She  has a past medical history of Medical history non-contributory. She does not have any pertinent problems on file. She  has a past surgical history that includes no surgery history; Cervical cerclage (2020); Cervical cerclage (N/A, 07/05/2019); and No past surgeries. Her family history includes Diabetes in her father; Healthy in her mother. She  reports that she has been smoking cigarettes. She has been smoking about 0.25 packs per day. She has never used smokeless tobacco. She reports current alcohol use. She reports that she does not use drugs. She has a current medication list which includes the following prescription(s): acetaminophen, acetaminophen, aspirin ec, and prenatal multivitamin. She is allergic to garcinia mangostana extract [garcinia cambogia], mangifera indica fruit ext (mango) [mangifera indica], and mango flavor.       Review of Systems:  Review of Systems  Constitutional: Denied constitutional symptoms, night sweats, recent illness, fatigue, fever, insomnia and weight loss.  Eyes: Denied eye symptoms, eye  pain, photophobia, vision change and visual disturbance.  Ears/Nose/Throat/Neck: Denied ear, nose, throat or neck symptoms, hearing loss, nasal discharge, sinus congestion and sore throat.  Cardiovascular: Denied cardiovascular symptoms, arrhythmia, chest pain/pressure, edema, exercise intolerance, orthopnea and palpitations.  Respiratory: Denied pulmonary symptoms, asthma, pleuritic pain, productive sputum, cough, dyspnea and wheezing.  Gastrointestinal: Denied, gastro-esophageal reflux, melena, nausea and vomiting.  Genitourinary: Denied genitourinary symptoms including symptomatic vaginal discharge, pelvic relaxation issues, and urinary complaints.  Musculoskeletal: Denied musculoskeletal symptoms, stiffness, swelling, muscle weakness and myalgia.  Dermatologic: Denied dermatology symptoms, rash and scar.  Neurologic: Denied neurology symptoms, dizziness, headache, neck pain and syncope.  Psychiatric: Denied psychiatric symptoms, anxiety and depression.  Endocrine: Denied endocrine symptoms including hot flashes and night sweats.   Meds:   Current Outpatient Medications on File Prior to Visit  Medication Sig Dispense Refill  . acetaminophen (TYLENOL) 500 MG tablet Take 500 mg by mouth every 6 (six) hours as needed.    Marland Kitchen acetaminophen (TYLENOL) 500 MG tablet Take 500 mg by mouth every 6 (six) hours as needed.    Marland Kitchen aspirin EC 81 MG tablet Take 1 tablet (81 mg total) by mouth daily. 30 tablet 6  . Prenatal Vit-Fe Fumarate-FA (PRENATAL MULTIVITAMIN) TABS tablet Take 1 tablet by mouth daily at 12 noon. 90 tablet 3   No current facility-administered medications on file prior to visit.          Objective:     Vitals:   08/04/20 0928  BP: 131/82  Pulse: 86   Filed Weights   08/04/20 0928  Weight: 178 lb 14.4 oz (81.1 kg)              Urinary pregnancy test positive  Assessment:    [id number] Patient Active Problem List   Diagnosis Date Noted  . Labor and delivery, indication  for care 08/03/2019  . History of preterm delivery 06/29/2019  . History of pregnancy loss in prior pregnancy, currently pregnant in second trimester 06/29/2019  . History of cerclage, currently pregnant 06/29/2019     1. Possible pregnancy, not confirmed   2. History of incompetent cervix, currently pregnant in first trimester   3. Possible exposure to STD     Patient currently pregnant approximately 6 weeks.  Significant risk for incompetent cervix and will likely require cerclage placement at the end of the first trimester.   Plan:            1.  GC/CT performed at patient request.  2.  We have discussed preterm labor and incompetent cervix in detail.  Advised patient to get prenatal care in [place] as soon as possible.  Advised signing record release before leaving the office today.  3.  Continue prenatal vitamins.  4.  Recommend first trimester ultrasound for accurate dating. Orders No orders of the defined types were placed in this encounter.   No orders of the defined types were placed in this encounter.     F/U  No follow-ups on file. I spent 25 minutes involved in the care of this patient preparing to see the patient by obtaining and reviewing her medical history (including labs, imaging tests and prior procedures), documenting clinical information in the electronic health record (EHR), counseling and coordinating care plans, writing and sending prescriptions, ordering tests or procedures and directly communicating with the patient by discussing pertinent items from her history and physical exam as well as detailing my assessment and plan as noted above so that she has an informed understanding.  All of her questions were answered.  Elonda Husky, M.D. 08/04/2020 9:49 AM

## 2020-08-04 NOTE — Addendum Note (Signed)
Addended by: Dorian Pod on: 08/04/2020 10:11 AM   Modules accepted: Orders

## 2020-08-06 LAB — URINE CULTURE

## 2020-08-06 LAB — GC/CHLAMYDIA PROBE AMP
Chlamydia trachomatis, NAA: NEGATIVE
Neisseria Gonorrhoeae by PCR: NEGATIVE

## 2020-09-11 NOTE — Addendum Note (Signed)
Addended by: Dorian Pod on: 09/11/2020 04:04 PM   Modules accepted: Orders

## 2020-09-12 ENCOUNTER — Encounter: Payer: Self-pay | Admitting: Surgical
